# Patient Record
Sex: Female | Born: 1970
Health system: Southern US, Community
[De-identification: ages and names within clinical notes are randomized; demographics above are authoritative.]

## PROBLEM LIST (undated history)

## (undated) DIAGNOSIS — F909 Attention-deficit hyperactivity disorder, unspecified type: Secondary | ICD-10-CM

## (undated) DIAGNOSIS — R011 Cardiac murmur, unspecified: Secondary | ICD-10-CM

## (undated) DIAGNOSIS — F32A Depression, unspecified: Secondary | ICD-10-CM

## (undated) HISTORY — DX: Attention-deficit hyperactivity disorder, unspecified type: F90.9

## (undated) HISTORY — PX: LEEP: SHX91

## (undated) HISTORY — DX: Cardiac murmur, unspecified: R01.1

## (undated) HISTORY — DX: Depression, unspecified: F32.A

---

## 2015-05-10 DIAGNOSIS — F4320 Adjustment disorder, unspecified: Secondary | ICD-10-CM | POA: Diagnosis not present

## 2015-05-31 DIAGNOSIS — F4320 Adjustment disorder, unspecified: Secondary | ICD-10-CM | POA: Diagnosis not present

## 2015-06-23 DIAGNOSIS — F4320 Adjustment disorder, unspecified: Secondary | ICD-10-CM | POA: Diagnosis not present

## 2015-07-04 DIAGNOSIS — Z6824 Body mass index (BMI) 24.0-24.9, adult: Secondary | ICD-10-CM | POA: Diagnosis not present

## 2015-07-04 DIAGNOSIS — Z01419 Encounter for gynecological examination (general) (routine) without abnormal findings: Secondary | ICD-10-CM | POA: Diagnosis not present

## 2015-07-04 DIAGNOSIS — Z1231 Encounter for screening mammogram for malignant neoplasm of breast: Secondary | ICD-10-CM | POA: Diagnosis not present

## 2015-07-14 DIAGNOSIS — F4320 Adjustment disorder, unspecified: Secondary | ICD-10-CM | POA: Diagnosis not present

## 2015-08-11 DIAGNOSIS — F4320 Adjustment disorder, unspecified: Secondary | ICD-10-CM | POA: Diagnosis not present

## 2015-08-30 DIAGNOSIS — F4320 Adjustment disorder, unspecified: Secondary | ICD-10-CM | POA: Diagnosis not present

## 2015-09-15 DIAGNOSIS — F4320 Adjustment disorder, unspecified: Secondary | ICD-10-CM | POA: Diagnosis not present

## 2015-09-30 DIAGNOSIS — F4320 Adjustment disorder, unspecified: Secondary | ICD-10-CM | POA: Diagnosis not present

## 2015-10-12 DIAGNOSIS — F4320 Adjustment disorder, unspecified: Secondary | ICD-10-CM | POA: Diagnosis not present

## 2015-10-24 DIAGNOSIS — F4320 Adjustment disorder, unspecified: Secondary | ICD-10-CM | POA: Diagnosis not present

## 2015-11-08 DIAGNOSIS — F4320 Adjustment disorder, unspecified: Secondary | ICD-10-CM | POA: Diagnosis not present

## 2015-11-21 DIAGNOSIS — F4320 Adjustment disorder, unspecified: Secondary | ICD-10-CM | POA: Diagnosis not present

## 2015-11-25 DIAGNOSIS — Z23 Encounter for immunization: Secondary | ICD-10-CM | POA: Diagnosis not present

## 2016-01-03 DIAGNOSIS — F4320 Adjustment disorder, unspecified: Secondary | ICD-10-CM | POA: Diagnosis not present

## 2016-01-31 DIAGNOSIS — F4320 Adjustment disorder, unspecified: Secondary | ICD-10-CM | POA: Diagnosis not present

## 2016-02-20 DIAGNOSIS — F4320 Adjustment disorder, unspecified: Secondary | ICD-10-CM | POA: Diagnosis not present

## 2016-02-24 DIAGNOSIS — F4323 Adjustment disorder with mixed anxiety and depressed mood: Secondary | ICD-10-CM | POA: Diagnosis not present

## 2016-03-08 DIAGNOSIS — F4323 Adjustment disorder with mixed anxiety and depressed mood: Secondary | ICD-10-CM | POA: Diagnosis not present

## 2016-03-13 DIAGNOSIS — F4320 Adjustment disorder, unspecified: Secondary | ICD-10-CM | POA: Diagnosis not present

## 2016-03-27 DIAGNOSIS — F4320 Adjustment disorder, unspecified: Secondary | ICD-10-CM | POA: Diagnosis not present

## 2016-04-18 DIAGNOSIS — F4320 Adjustment disorder, unspecified: Secondary | ICD-10-CM | POA: Diagnosis not present

## 2016-04-19 DIAGNOSIS — L814 Other melanin hyperpigmentation: Secondary | ICD-10-CM | POA: Diagnosis not present

## 2016-04-19 DIAGNOSIS — L821 Other seborrheic keratosis: Secondary | ICD-10-CM | POA: Diagnosis not present

## 2016-04-19 DIAGNOSIS — D225 Melanocytic nevi of trunk: Secondary | ICD-10-CM | POA: Diagnosis not present

## 2016-04-19 DIAGNOSIS — D1800 Hemangioma unspecified site: Secondary | ICD-10-CM | POA: Diagnosis not present

## 2016-04-24 DIAGNOSIS — F4323 Adjustment disorder with mixed anxiety and depressed mood: Secondary | ICD-10-CM | POA: Diagnosis not present

## 2016-05-07 DIAGNOSIS — M25561 Pain in right knee: Secondary | ICD-10-CM | POA: Diagnosis not present

## 2016-05-07 DIAGNOSIS — T148XXA Other injury of unspecified body region, initial encounter: Secondary | ICD-10-CM | POA: Diagnosis not present

## 2016-05-16 DIAGNOSIS — F4320 Adjustment disorder, unspecified: Secondary | ICD-10-CM | POA: Diagnosis not present

## 2016-05-21 DIAGNOSIS — R232 Flushing: Secondary | ICD-10-CM | POA: Diagnosis not present

## 2016-05-21 DIAGNOSIS — N926 Irregular menstruation, unspecified: Secondary | ICD-10-CM | POA: Diagnosis not present

## 2016-05-22 DIAGNOSIS — F4323 Adjustment disorder with mixed anxiety and depressed mood: Secondary | ICD-10-CM | POA: Diagnosis not present

## 2016-05-28 DIAGNOSIS — F4320 Adjustment disorder, unspecified: Secondary | ICD-10-CM | POA: Diagnosis not present

## 2016-06-06 DIAGNOSIS — F4323 Adjustment disorder with mixed anxiety and depressed mood: Secondary | ICD-10-CM | POA: Diagnosis not present

## 2016-06-19 DIAGNOSIS — F4320 Adjustment disorder, unspecified: Secondary | ICD-10-CM | POA: Diagnosis not present

## 2016-06-21 DIAGNOSIS — F4323 Adjustment disorder with mixed anxiety and depressed mood: Secondary | ICD-10-CM | POA: Diagnosis not present

## 2016-07-03 DIAGNOSIS — F4323 Adjustment disorder with mixed anxiety and depressed mood: Secondary | ICD-10-CM | POA: Diagnosis not present

## 2016-07-09 DIAGNOSIS — F4320 Adjustment disorder, unspecified: Secondary | ICD-10-CM | POA: Diagnosis not present

## 2016-07-10 DIAGNOSIS — Z6825 Body mass index (BMI) 25.0-25.9, adult: Secondary | ICD-10-CM | POA: Diagnosis not present

## 2016-07-10 DIAGNOSIS — Z1231 Encounter for screening mammogram for malignant neoplasm of breast: Secondary | ICD-10-CM | POA: Diagnosis not present

## 2016-07-10 DIAGNOSIS — Z01419 Encounter for gynecological examination (general) (routine) without abnormal findings: Secondary | ICD-10-CM | POA: Diagnosis not present

## 2016-07-11 DIAGNOSIS — F4323 Adjustment disorder with mixed anxiety and depressed mood: Secondary | ICD-10-CM | POA: Diagnosis not present

## 2016-07-27 DIAGNOSIS — F4323 Adjustment disorder with mixed anxiety and depressed mood: Secondary | ICD-10-CM | POA: Diagnosis not present

## 2016-08-02 DIAGNOSIS — F4320 Adjustment disorder, unspecified: Secondary | ICD-10-CM | POA: Diagnosis not present

## 2016-08-07 DIAGNOSIS — F4323 Adjustment disorder with mixed anxiety and depressed mood: Secondary | ICD-10-CM | POA: Diagnosis not present

## 2016-08-10 DIAGNOSIS — F4323 Adjustment disorder with mixed anxiety and depressed mood: Secondary | ICD-10-CM | POA: Diagnosis not present

## 2016-08-14 DIAGNOSIS — F4320 Adjustment disorder, unspecified: Secondary | ICD-10-CM | POA: Diagnosis not present

## 2016-08-31 DIAGNOSIS — F4323 Adjustment disorder with mixed anxiety and depressed mood: Secondary | ICD-10-CM | POA: Diagnosis not present

## 2016-09-04 DIAGNOSIS — F4320 Adjustment disorder, unspecified: Secondary | ICD-10-CM | POA: Diagnosis not present

## 2016-09-07 DIAGNOSIS — F4323 Adjustment disorder with mixed anxiety and depressed mood: Secondary | ICD-10-CM | POA: Diagnosis not present

## 2016-09-19 DIAGNOSIS — F4320 Adjustment disorder, unspecified: Secondary | ICD-10-CM | POA: Diagnosis not present

## 2016-09-20 DIAGNOSIS — F4323 Adjustment disorder with mixed anxiety and depressed mood: Secondary | ICD-10-CM | POA: Diagnosis not present

## 2016-10-04 DIAGNOSIS — F4320 Adjustment disorder, unspecified: Secondary | ICD-10-CM | POA: Diagnosis not present

## 2016-10-16 DIAGNOSIS — F4320 Adjustment disorder, unspecified: Secondary | ICD-10-CM | POA: Diagnosis not present

## 2016-10-19 DIAGNOSIS — F4323 Adjustment disorder with mixed anxiety and depressed mood: Secondary | ICD-10-CM | POA: Diagnosis not present

## 2016-10-30 DIAGNOSIS — F4323 Adjustment disorder with mixed anxiety and depressed mood: Secondary | ICD-10-CM | POA: Diagnosis not present

## 2016-11-13 DIAGNOSIS — F4320 Adjustment disorder, unspecified: Secondary | ICD-10-CM | POA: Diagnosis not present

## 2016-11-14 DIAGNOSIS — F4323 Adjustment disorder with mixed anxiety and depressed mood: Secondary | ICD-10-CM | POA: Diagnosis not present

## 2016-11-20 DIAGNOSIS — Z23 Encounter for immunization: Secondary | ICD-10-CM | POA: Diagnosis not present

## 2016-11-27 DIAGNOSIS — F4323 Adjustment disorder with mixed anxiety and depressed mood: Secondary | ICD-10-CM | POA: Diagnosis not present

## 2016-12-04 DIAGNOSIS — F4320 Adjustment disorder, unspecified: Secondary | ICD-10-CM | POA: Diagnosis not present

## 2016-12-13 DIAGNOSIS — F4323 Adjustment disorder with mixed anxiety and depressed mood: Secondary | ICD-10-CM | POA: Diagnosis not present

## 2016-12-17 DIAGNOSIS — F4323 Adjustment disorder with mixed anxiety and depressed mood: Secondary | ICD-10-CM | POA: Diagnosis not present

## 2016-12-18 DIAGNOSIS — N390 Urinary tract infection, site not specified: Secondary | ICD-10-CM | POA: Diagnosis not present

## 2016-12-28 DIAGNOSIS — R102 Pelvic and perineal pain: Secondary | ICD-10-CM | POA: Diagnosis not present

## 2016-12-28 DIAGNOSIS — R300 Dysuria: Secondary | ICD-10-CM | POA: Diagnosis not present

## 2017-01-03 DIAGNOSIS — F4320 Adjustment disorder, unspecified: Secondary | ICD-10-CM | POA: Diagnosis not present

## 2017-01-10 DIAGNOSIS — F4323 Adjustment disorder with mixed anxiety and depressed mood: Secondary | ICD-10-CM | POA: Diagnosis not present

## 2017-01-16 DIAGNOSIS — F4323 Adjustment disorder with mixed anxiety and depressed mood: Secondary | ICD-10-CM | POA: Diagnosis not present

## 2017-01-30 DIAGNOSIS — R300 Dysuria: Secondary | ICD-10-CM | POA: Diagnosis not present

## 2017-01-31 DIAGNOSIS — F4323 Adjustment disorder with mixed anxiety and depressed mood: Secondary | ICD-10-CM | POA: Diagnosis not present

## 2017-02-04 DIAGNOSIS — R300 Dysuria: Secondary | ICD-10-CM | POA: Diagnosis not present

## 2017-02-04 DIAGNOSIS — Z118 Encounter for screening for other infectious and parasitic diseases: Secondary | ICD-10-CM | POA: Diagnosis not present

## 2017-02-04 DIAGNOSIS — N760 Acute vaginitis: Secondary | ICD-10-CM | POA: Diagnosis not present

## 2017-02-13 DIAGNOSIS — F4323 Adjustment disorder with mixed anxiety and depressed mood: Secondary | ICD-10-CM | POA: Diagnosis not present

## 2017-02-14 DIAGNOSIS — F4320 Adjustment disorder, unspecified: Secondary | ICD-10-CM | POA: Diagnosis not present

## 2017-02-25 DIAGNOSIS — B373 Candidiasis of vulva and vagina: Secondary | ICD-10-CM | POA: Diagnosis not present

## 2017-02-25 DIAGNOSIS — R102 Pelvic and perineal pain: Secondary | ICD-10-CM | POA: Diagnosis not present

## 2017-02-28 DIAGNOSIS — F4320 Adjustment disorder, unspecified: Secondary | ICD-10-CM | POA: Diagnosis not present

## 2017-03-07 DIAGNOSIS — F4323 Adjustment disorder with mixed anxiety and depressed mood: Secondary | ICD-10-CM | POA: Diagnosis not present

## 2017-03-14 DIAGNOSIS — F4320 Adjustment disorder, unspecified: Secondary | ICD-10-CM | POA: Diagnosis not present

## 2017-03-21 DIAGNOSIS — N898 Other specified noninflammatory disorders of vagina: Secondary | ICD-10-CM | POA: Diagnosis not present

## 2017-03-21 DIAGNOSIS — F4323 Adjustment disorder with mixed anxiety and depressed mood: Secondary | ICD-10-CM | POA: Diagnosis not present

## 2017-03-21 DIAGNOSIS — R350 Frequency of micturition: Secondary | ICD-10-CM | POA: Diagnosis not present

## 2017-04-03 DIAGNOSIS — F4320 Adjustment disorder, unspecified: Secondary | ICD-10-CM | POA: Diagnosis not present

## 2017-04-09 DIAGNOSIS — F4323 Adjustment disorder with mixed anxiety and depressed mood: Secondary | ICD-10-CM | POA: Diagnosis not present

## 2017-04-25 DIAGNOSIS — F4320 Adjustment disorder, unspecified: Secondary | ICD-10-CM | POA: Diagnosis not present

## 2017-04-29 DIAGNOSIS — F4323 Adjustment disorder with mixed anxiety and depressed mood: Secondary | ICD-10-CM | POA: Diagnosis not present

## 2017-05-29 DIAGNOSIS — F4323 Adjustment disorder with mixed anxiety and depressed mood: Secondary | ICD-10-CM | POA: Diagnosis not present

## 2017-05-30 DIAGNOSIS — F4320 Adjustment disorder, unspecified: Secondary | ICD-10-CM | POA: Diagnosis not present

## 2017-06-12 DIAGNOSIS — F4323 Adjustment disorder with mixed anxiety and depressed mood: Secondary | ICD-10-CM | POA: Diagnosis not present

## 2017-06-18 DIAGNOSIS — H906 Mixed conductive and sensorineural hearing loss, bilateral: Secondary | ICD-10-CM | POA: Diagnosis not present

## 2017-06-18 DIAGNOSIS — H903 Sensorineural hearing loss, bilateral: Secondary | ICD-10-CM | POA: Diagnosis not present

## 2017-06-19 DIAGNOSIS — F4320 Adjustment disorder, unspecified: Secondary | ICD-10-CM | POA: Diagnosis not present

## 2017-06-21 DIAGNOSIS — F4323 Adjustment disorder with mixed anxiety and depressed mood: Secondary | ICD-10-CM | POA: Diagnosis not present

## 2017-06-28 DIAGNOSIS — F4323 Adjustment disorder with mixed anxiety and depressed mood: Secondary | ICD-10-CM | POA: Diagnosis not present

## 2017-07-15 DIAGNOSIS — F4323 Adjustment disorder with mixed anxiety and depressed mood: Secondary | ICD-10-CM | POA: Diagnosis not present

## 2017-07-17 DIAGNOSIS — Z1151 Encounter for screening for human papillomavirus (HPV): Secondary | ICD-10-CM | POA: Diagnosis not present

## 2017-07-17 DIAGNOSIS — Z1231 Encounter for screening mammogram for malignant neoplasm of breast: Secondary | ICD-10-CM | POA: Diagnosis not present

## 2017-07-17 DIAGNOSIS — Z01419 Encounter for gynecological examination (general) (routine) without abnormal findings: Secondary | ICD-10-CM | POA: Diagnosis not present

## 2017-07-17 DIAGNOSIS — Z6822 Body mass index (BMI) 22.0-22.9, adult: Secondary | ICD-10-CM | POA: Diagnosis not present

## 2017-07-18 DIAGNOSIS — F4320 Adjustment disorder, unspecified: Secondary | ICD-10-CM | POA: Diagnosis not present

## 2017-07-22 DIAGNOSIS — F4323 Adjustment disorder with mixed anxiety and depressed mood: Secondary | ICD-10-CM | POA: Diagnosis not present

## 2017-07-29 DIAGNOSIS — F4323 Adjustment disorder with mixed anxiety and depressed mood: Secondary | ICD-10-CM | POA: Diagnosis not present

## 2017-08-19 DIAGNOSIS — F4320 Adjustment disorder, unspecified: Secondary | ICD-10-CM | POA: Diagnosis not present

## 2017-09-16 DIAGNOSIS — F4320 Adjustment disorder, unspecified: Secondary | ICD-10-CM | POA: Diagnosis not present

## 2017-10-14 DIAGNOSIS — F4320 Adjustment disorder, unspecified: Secondary | ICD-10-CM | POA: Diagnosis not present

## 2017-11-14 DIAGNOSIS — F4320 Adjustment disorder, unspecified: Secondary | ICD-10-CM | POA: Diagnosis not present

## 2017-12-19 DIAGNOSIS — F4320 Adjustment disorder, unspecified: Secondary | ICD-10-CM | POA: Diagnosis not present

## 2018-01-16 DIAGNOSIS — F4320 Adjustment disorder, unspecified: Secondary | ICD-10-CM | POA: Diagnosis not present

## 2018-02-05 DIAGNOSIS — F4320 Adjustment disorder, unspecified: Secondary | ICD-10-CM | POA: Diagnosis not present

## 2018-03-05 DIAGNOSIS — F4320 Adjustment disorder, unspecified: Secondary | ICD-10-CM | POA: Diagnosis not present

## 2018-03-26 DIAGNOSIS — F4320 Adjustment disorder, unspecified: Secondary | ICD-10-CM | POA: Diagnosis not present

## 2018-04-11 DIAGNOSIS — F4320 Adjustment disorder, unspecified: Secondary | ICD-10-CM | POA: Diagnosis not present

## 2018-05-06 DIAGNOSIS — F4320 Adjustment disorder, unspecified: Secondary | ICD-10-CM | POA: Diagnosis not present

## 2018-05-26 DIAGNOSIS — F4320 Adjustment disorder, unspecified: Secondary | ICD-10-CM | POA: Diagnosis not present

## 2018-05-30 DIAGNOSIS — N950 Postmenopausal bleeding: Secondary | ICD-10-CM | POA: Diagnosis not present

## 2018-06-09 DIAGNOSIS — N950 Postmenopausal bleeding: Secondary | ICD-10-CM | POA: Diagnosis not present

## 2018-06-30 DIAGNOSIS — F4320 Adjustment disorder, unspecified: Secondary | ICD-10-CM | POA: Diagnosis not present

## 2018-07-17 ENCOUNTER — Ambulatory Visit (HOSPITAL_COMMUNITY)
Admission: EM | Admit: 2018-07-17 | Discharge: 2018-07-17 | Disposition: A | Discharge status: Home / Self Care | Payer: BC Managed Care – PPO | Attending: Family Medicine | Admitting: Family Medicine

## 2018-07-17 ENCOUNTER — Encounter (HOSPITAL_COMMUNITY): Payer: Self-pay | Admitting: Emergency Medicine

## 2018-07-17 DIAGNOSIS — A6920 Lyme disease, unspecified: Secondary | ICD-10-CM

## 2018-07-17 DIAGNOSIS — W57XXXA Bitten or stung by nonvenomous insect and other nonvenomous arthropods, initial encounter: Secondary | ICD-10-CM

## 2018-07-17 DIAGNOSIS — S70369A Insect bite (nonvenomous), unspecified thigh, initial encounter: Secondary | ICD-10-CM

## 2018-07-17 MED ORDER — DOXYCYCLINE HYCLATE 100 MG PO CAPS: ORAL_CAPSULE | ORAL | 0 refills | Status: DC

## 2018-07-17 MED ORDER — DOXYCYCLINE HYCLATE 100 MG PO CAPS: 100.0000 mg | ORAL_CAPSULE | Freq: Two times a day (BID) | ORAL | 0 refills | Status: AC

## 2018-07-17 NOTE — Discharge Instructions (Signed)
Take antibiotic 2 times a day for 2 weeks Take with food to prevent stomach upset I recommend a probiotic to prevent GI distress Call for problems

## 2018-07-17 NOTE — ED Triage Notes (Signed)
Pt states shes been bitten by several ticks, states she noticed a spot on her leg with a rash, doesn't remember if she was bitten there or not. Rash x3 days.

## 2018-07-17 NOTE — ED Provider Notes (Signed)
[facility]    CSN: [id number] Arrival date & time: 07/17/18  [id number]     History   Chief Complaint Chief Complaint  Patient presents with  . Appointment    950  . Rash    HPI Michele Sanchez is a 48 y.o. female.   HPI   Healthy on no meds Has had several tick bites this spring Has removed 3 in last 2-3 weeks Had one on her thigh 10 d ago, removed.  Now for the last 3 d has gradually expanding rash around area No fever or chills No myalgia or arthralgia  History reviewed. No pertinent past medical history.  There are no active problems to display for this patient.   Past Surgical History:  Procedure Laterality Date  . CESAREAN SECTION      OB History   No obstetric history on file.      Home Medications    Prior to Admission medications   Medication Sig Start Date End Date Taking? Authorizing Provider  doxycycline (VIBRAMYCIN) 100 MG capsule Take 1 capsule (100 mg total) by mouth 2 (two) times daily for 14 days. 07/17/18 07/31/18  Eustace MooreNelson, Kassim Guertin Sue, MD    Family History Family History  Problem Relation Age of Onset  . Heart failure Mother   . Diabetes Sister     Social History Social History   Tobacco Use  . Smoking status: Never Smoker  Substance Use Topics  . Alcohol use: Yes  . Drug use: Never     Allergies   Cashew nut oil   Review of Systems Review of Systems  Constitutional: Negative for chills and fever.  HENT: Negative for ear pain and sore throat.   Eyes: Negative for pain and visual disturbance.  Respiratory: Negative for cough and shortness of breath.   Cardiovascular: Negative for chest pain and palpitations.  Gastrointestinal: Negative for abdominal pain and vomiting.  Genitourinary: Negative for dysuria and hematuria.  Musculoskeletal: Negative for arthralgias and back pain.  Skin: Positive for wound. Negative for color change and rash.  Neurological: Negative for seizures and syncope.  All other systems  reviewed and are negative.    Physical Exam Triage Vital Signs ED Triage Vitals  Enc Vitals Group     BP 07/17/18 0950 (!) 116/49     Pulse Rate 07/17/18 0950 83     Resp 07/17/18 0950 16     Temp 07/17/18 0950 98.5 F (36.9 C)     Temp src --      SpO2 07/17/18 0950 99 %     Weight --      Height --      Head Circumference --      Peak Flow --      Pain Score 07/17/18 0953 1     Pain Loc --      Pain Edu? --      Excl. in GC? --    No data found.  Updated Vital Signs BP (!) 116/49   Pulse 83   Temp 98.5 F (36.9 C)   Resp 16   SpO2 99%      Physical Exam Constitutional:      General: She is not in acute distress.    Appearance: She is well-developed.  HENT:     Head: Normocephalic and atraumatic.  Eyes:     Conjunctiva/sclera: Conjunctivae normal.     Pupils: Pupils are equal, round, and reactive to light.  Neck:     Musculoskeletal:  Normal range of motion.  Cardiovascular:     Rate and Rhythm: Normal rate.  Pulmonary:     Effort: Pulmonary effort is normal. No respiratory distress.  Abdominal:     General: There is no distension.     Palpations: Abdomen is soft.  Musculoskeletal: Normal range of motion.  Skin:    General: Skin is warm and dry.     Comments: See Photo.  The photo does not capture the clearing center and advancing edge well, although it is faintly present.  Neurological:     Mental Status: She is alert.        UC Treatments / Results  Labs (all labs ordered are listed, but only abnormal results are displayed) Labs Reviewed - No data to display  EKG None  Radiology No results found.  Procedures Procedures (including critical care time)  Medications Ordered in UC Medications - No data to display  Initial Impression / Assessment and Plan / UC Course  I have reviewed the triage vital signs and the nursing notes.  Pertinent labs & imaging results that were available during my care of the patient were reviewed by me and  considered in my medical decision making (see chart for details).     Discussed Lyme disease.  Given written information Final Clinical Impressions(s) / UC Diagnoses   Final diagnoses:  Tick bite, initial encounter  Erythema migrans (Lyme disease)     Discharge Instructions     Take antibiotic 2 times a day for 2 weeks Take with food to prevent stomach upset I recommend a probiotic to prevent GI distress Call for problems    ED Prescriptions    Medication Sig Dispense Auth. Provider   doxycycline (VIBRAMYCIN) 100 MG capsule Take 1 capsule (100 mg total) by mouth 2 (two) times daily for 14 days. 28 capsule Raylene Everts, MD     Controlled Substance Prescriptions [place] Controlled Substance Registry consulted? Not Applicable   Raylene Everts, MD 07/17/18 1054

## 2018-07-21 DIAGNOSIS — F4320 Adjustment disorder, unspecified: Secondary | ICD-10-CM | POA: Diagnosis not present

## 2018-07-22 DIAGNOSIS — Z6824 Body mass index (BMI) 24.0-24.9, adult: Secondary | ICD-10-CM | POA: Diagnosis not present

## 2018-07-22 DIAGNOSIS — Z01419 Encounter for gynecological examination (general) (routine) without abnormal findings: Secondary | ICD-10-CM | POA: Diagnosis not present

## 2018-07-22 DIAGNOSIS — Z124 Encounter for screening for malignant neoplasm of cervix: Secondary | ICD-10-CM | POA: Diagnosis not present

## 2018-07-22 DIAGNOSIS — Z1231 Encounter for screening mammogram for malignant neoplasm of breast: Secondary | ICD-10-CM | POA: Diagnosis not present

## 2018-07-22 DIAGNOSIS — Z1151 Encounter for screening for human papillomavirus (HPV): Secondary | ICD-10-CM | POA: Diagnosis not present

## 2018-08-04 DIAGNOSIS — Z13220 Encounter for screening for lipoid disorders: Secondary | ICD-10-CM | POA: Diagnosis not present

## 2018-08-04 DIAGNOSIS — Z0000 Encounter for general adult medical examination without abnormal findings: Secondary | ICD-10-CM | POA: Diagnosis not present

## 2018-08-04 DIAGNOSIS — Z1329 Encounter for screening for other suspected endocrine disorder: Secondary | ICD-10-CM | POA: Diagnosis not present

## 2018-08-04 DIAGNOSIS — R7301 Impaired fasting glucose: Secondary | ICD-10-CM | POA: Diagnosis not present

## 2018-08-14 DIAGNOSIS — F4320 Adjustment disorder, unspecified: Secondary | ICD-10-CM | POA: Diagnosis not present

## 2018-09-16 DIAGNOSIS — F4320 Adjustment disorder, unspecified: Secondary | ICD-10-CM | POA: Diagnosis not present

## 2018-09-18 DIAGNOSIS — B373 Candidiasis of vulva and vagina: Secondary | ICD-10-CM | POA: Diagnosis not present

## 2018-09-18 DIAGNOSIS — R300 Dysuria: Secondary | ICD-10-CM | POA: Diagnosis not present

## 2018-09-25 DIAGNOSIS — R350 Frequency of micturition: Secondary | ICD-10-CM | POA: Diagnosis not present

## 2018-10-03 DIAGNOSIS — R109 Unspecified abdominal pain: Secondary | ICD-10-CM | POA: Diagnosis not present

## 2018-10-13 DIAGNOSIS — R350 Frequency of micturition: Secondary | ICD-10-CM | POA: Diagnosis not present

## 2018-10-20 DIAGNOSIS — F4320 Adjustment disorder, unspecified: Secondary | ICD-10-CM | POA: Diagnosis not present

## 2018-10-24 ENCOUNTER — Ambulatory Visit (HOSPITAL_COMMUNITY)
Admission: EM | Admit: 2018-10-24 | Discharge: 2018-10-24 | Disposition: A | Discharge status: Home / Self Care | Payer: BC Managed Care – PPO | Attending: Emergency Medicine | Admitting: Emergency Medicine

## 2018-10-24 ENCOUNTER — Other Ambulatory Visit: Payer: Self-pay

## 2018-10-24 ENCOUNTER — Encounter (HOSPITAL_COMMUNITY): Payer: Self-pay

## 2018-10-24 DIAGNOSIS — M5432 Sciatica, left side: Secondary | ICD-10-CM

## 2018-10-24 DIAGNOSIS — D2371 Other benign neoplasm of skin of right lower limb, including hip: Secondary | ICD-10-CM | POA: Diagnosis not present

## 2018-10-24 DIAGNOSIS — D225 Melanocytic nevi of trunk: Secondary | ICD-10-CM | POA: Diagnosis not present

## 2018-10-24 MED ORDER — IBUPROFEN 600 MG PO TABS: 600.0000 mg | ORAL_TABLET | Freq: Four times a day (QID) | ORAL | 0 refills | Status: DC | PRN

## 2018-10-24 MED ORDER — METHYLPREDNISOLONE 4 MG PO TBPK: ORAL_TABLET | Freq: Every day | ORAL | 0 refills | Status: DC

## 2018-10-24 MED ORDER — TIZANIDINE HCL 4 MG PO TABS: 4.0000 mg | ORAL_TABLET | Freq: Three times a day (TID) | ORAL | 0 refills | Status: DC | PRN

## 2018-10-24 NOTE — Discharge Instructions (Addendum)
Take 600 mg of ibuprofen combined with 1 g of Tylenol 3-4 times a day as needed for pain.  Continue pressure points, stretching, massage.  Zanaflex will help with any spasms and the steroids will help with the inflammation.

## 2018-10-24 NOTE — ED Provider Notes (Signed)
HPI  SUBJECTIVE:  Michele Sanchez is a 48 y.o. female who presents with 1 month of daily, constant low back pain that goes down her buttock into the posterior leg.  She describes it as like spasms.  She states that she was doing heavy lifting prior to the symptoms starting.  No trauma, change in her physical activity.  No fevers, nausea, vomiting, urinary complaints, saddle anesthesia.  No bilateral radicular pain, leg weakness, distal numbness or tingling, urinary or fecal incontinence, urinary retention.  No syncope.  She has tried Tylenol, massage, pressure-point release, change in her diet, stretching.  She takes Advil 200 to 400 mg once or twice a day from time to time.  Last dose was within 4-6 hours of evaluation.  She has also tried Aleve.  Symptoms are better with pressure points, Advil, standing.  Symptoms worse with sitting, bending forward.  She has a past medical history of a "pinched nerve" in her neck, UTI.  No history of pyelonephritis, nephrolithiasis, back injury, HIV, cancer, multiple myeloma, diabetes, hypertension, IV drug use.  LMP: She is perimenopausal.  April 2020.  Denies the possibility of being pregnant.  PMD: Her OB/GYN.   History reviewed. No pertinent past medical history.  Past Surgical History:  Procedure Laterality Date  . CESAREAN SECTION      Family History  Problem Relation Age of Onset  . Heart failure Mother   . Diabetes Sister     Social History   Tobacco Use  . Smoking status: Never Smoker  Substance Use Topics  . Alcohol use: Yes  . Drug use: Never    No current facility-administered medications for this encounter.   Current Outpatient Medications:  .  Estradiol 10 MCG TABS vaginal tablet, I 1 T INTRAVAGINALLY 2 TIMES A WK, Disp: , Rfl:  .  ibuprofen (ADVIL) 600 MG tablet, Take 1 tablet (600 mg total) by mouth every 6 (six) hours as needed., Disp: 30 tablet, Rfl: 0 .  methylPREDNISolone (MEDROL DOSEPAK) 4 MG TBPK tablet, Take by mouth  daily. Follow package instructions, Disp: 21 tablet, Rfl: 0 .  tiZANidine (ZANAFLEX) 4 MG tablet, Take 1 tablet (4 mg total) by mouth every 8 (eight) hours as needed for muscle spasms., Disp: 30 tablet, Rfl: 0  Allergies  Allergen Reactions  . Cashew Nut Oil      ROS  As noted in HPI.   Physical Exam  BP 122/81 (BP Location: Right Arm)   Pulse 76   Temp 98 F (36.7 C) (Oral)   Resp 14   SpO2 99%   Constitutional: Well developed, well nourished, no acute distress Eyes:  EOMI, conjunctiva normal bilaterally HENT: Normocephalic, atraumatic,mucus membranes moist Respiratory: Normal inspiratory effort Cardiovascular: Normal rate GI: nondistended. No suprapubic tenderness skin: No rash, skin intact Musculoskeletal: no CVAT.  - paralumbar tenderness, - muscle spasm.  No L-spine tenderness.  Positive tenderness over the left lower buttock, at sciatic notch, down the back of the posterior upper leg.  Bilateral lower extremities nontender, baseline ROM with intact DP pulses. No pain with int/ext rotation flex hips bilaterally.  Pain aggravated with left hip extension against resistance.  SLR neg bilaterally. Sensation baseline light touch bilaterally for Pt, DTR's symmetric and intact bilaterally KJ, Motor symmetric bilateral 5/5 hip flexion, quadriceps, hamstrings, EHL, foot dorsiflexion, foot plantarflexion, gait normal. Neurologic: Alert & oriented x 3, no focal neuro deficits Psychiatric: Speech and behavior appropriate   ED Course   Medications - No data to display  No orders of the defined types were placed in this encounter.   No results found for this or any previous visit (from the past 24 hour(s)). No results found.  ED Clinical Impression  1. Sciatica of left side     ED Assessment/Plan  Patient with left-sided sciatica.  No evidence of spinal cord involvement based on H&P.  Pain has been < 6 week duration. Offered to do L-spine films today, but patient declined.   Advised her that she will need to have imaging of some sort if this does not resolve in 2 weeks. No historical red flags as noted in HPI. No physical red flags such as fever, bony tenderness, lower extremity weakness, saddle anesthesia.  Patient took Advil 400 mg about 2 hours prior to evaluation. cannt give toradol.  Pt ambulatory in the UC. Home with 600 mg ibuprofen combined with 1 g of Tylenol 3-4 x/day, Medrol Dosepak, Zanaflex.  We will have her follow-up with orthopedics, Dr. Doreatha Martin on call, or with sports medicine Drs. Murriel Hopper or Raeford Razor for further management.  Discussed  medical decision-making, and plan for follow-up with the patient.  Discussed signs and symptoms that should prompt return to the emergency department.  Patient agrees with plan.   Meds ordered this encounter  Medications  . ibuprofen (ADVIL) 600 MG tablet    Sig: Take 1 tablet (600 mg total) by mouth every 6 (six) hours as needed.    Dispense:  30 tablet    Refill:  0  . tiZANidine (ZANAFLEX) 4 MG tablet    Sig: Take 1 tablet (4 mg total) by mouth every 8 (eight) hours as needed for muscle spasms.    Dispense:  30 tablet    Refill:  0  . methylPREDNISolone (MEDROL DOSEPAK) 4 MG TBPK tablet    Sig: Take by mouth daily. Follow package instructions    Dispense:  21 tablet    Refill:  0    *This clinic note was created using Dragon dictation software. Therefore, there may be occasional mistakes despite careful proofreading.  ?     Melynda Ripple, MD 10/25/18 [phone]

## 2018-10-24 NOTE — ED Triage Notes (Signed)
Patient report having lower back pain and left buttock pain x 4 weeks,  is worse when she is sitting down. She is taking Tylenol .

## 2018-11-13 DIAGNOSIS — F4320 Adjustment disorder, unspecified: Secondary | ICD-10-CM | POA: Diagnosis not present

## 2018-11-16 NOTE — Progress Notes (Signed)
[facility] [place], [place] [address] Phone: [phone] Subjective:   Michele Sanchez, am serving as a scribe for Dr. Hulan Saas.  I'm seeing this patient by the request  of:    CC: Left-sided back pain  [id number]  Michele Sanchez is a 48 y.o. female coming in with complaint of lower back pain that started at the end of August. Pain radiates down to the left glute into her calf. Is constant. Last night had a hard time sleeping but has not been having issue sleeping until then. Has been using massage therapy and stretching. Patient has been active despite pain. Pain is less but radiating symptom are worse. Only used mm relaxer 2x as it did not help.      Sanchez past medical history on file. Past Surgical History:  Procedure Laterality Date  . CESAREAN SECTION     Social History   Socioeconomic History  . Marital status: Divorced    Spouse name: Not on file  . Number of children: Not on file  . Years of education: Not on file  . Highest education level: Not on file  Occupational History  . Not on file  Social Needs  . Financial resource strain: Not on file  . Food insecurity    Worry: Not on file    Inability: Not on file  . Transportation needs    Medical: Not on file    Non-medical: Not on file  Tobacco Use  . Smoking status: Never Smoker  Substance and Sexual Activity  . Alcohol use: Yes  . Drug use: Never  . Sexual activity: Not on file  Lifestyle  . Physical activity    Days per week: Not on file    Minutes per session: Not on file  . Stress: Not on file  Relationships  . Social Herbalist on phone: Not on file    Gets together: Not on file    Attends religious service: Not on file    Active member of club or organization: Not on file    Attends meetings of clubs or organizations: Not on file    Relationship status: Not on file  Other Topics Concern  . Not on file  Social History Narrative  .  Not on file   Allergies  Allergen Reactions  . Cashew Nut Oil    Family History  Problem Relation Age of Onset  . Heart failure Mother   . Diabetes Sister     Current Outpatient Medications (Endocrine & Metabolic):  .  methylPREDNISolone (MEDROL DOSEPAK) 4 MG TBPK tablet, Take by mouth daily. Follow package instructions    Current Outpatient Medications (Analgesics):  .  ibuprofen (ADVIL) 600 MG tablet, Take 1 tablet (600 mg total) by mouth every 6 (six) hours as needed. .  meloxicam (MOBIC) 15 MG tablet, Take 1 tablet (15 mg total) by mouth daily.   Current Outpatient Medications (Other):  .  Estradiol 10 MCG TABS vaginal tablet, I 1 T INTRAVAGINALLY 2 TIMES A WK .  tiZANidine (ZANAFLEX) 4 MG tablet, Take 1 tablet (4 mg total) by mouth every 8 (eight) hours as needed for muscle spasms. Marland Kitchen  gabapentin (NEURONTIN) 100 MG capsule, Take 2 capsules (200 mg total) by mouth at bedtime.    Past medical history, social, surgical and family history all reviewed in electronic medical record.  Sanchez pertanent information unless stated regarding to the chief complaint.   Review  of Systems:  Sanchez headache, visual changes, nausea, vomiting, diarrhea, constipation, dizziness, abdominal pain, skin rash, fevers, chills, night sweats, weight loss, swollen lymph nodes, body aches, joint swelling, muscle aches, chest pain, shortness of breath, mood changes.   Objective  Blood pressure 102/72, pulse 91, weight 143 lb (64.9 kg), SpO2 97 %.    General: Sanchez apparent distress alert and oriented x3 mood and affect normal, dressed appropriately.  HEENT: Pupils equal, extraocular movements intact  Respiratory: Patient's speak in full sentences and does not appear short of breath  Cardiovascular: Sanchez lower extremity edema, non tender, Sanchez erythema  Skin: Warm dry intact with Sanchez signs of infection or rash on extremities or on axial skeleton.  Abdomen: Soft nontender  Neuro: Cranial nerves II through XII are  intact, neurovascularly intact in all extremities with 2+ DTRs and 2+ pulses.  Lymph: Sanchez lymphadenopathy of posterior or anterior cervical chain or axillae bilaterally.  Gait normal with good balance and coordination.  MSK:  Non tender with full range of motion and good stability and symmetric strength and tone of shoulders, elbows, wrist, hip, knee and ankles bilaterally.  Back Exam:  Inspection: Mild scoliosis of the lumbar spine Motion: Flexion 45 deg, Extension 25 deg, Side Bending to 35 deg bilaterally,  Rotation to 45 deg bilaterally  SLR laying: Negative tightness left but Sanchez radicular symptoms XSLR laying: Negative  Palpable tenderness: Tender to palpation more in the paraspinal musculature in the lumbar spine L5-S1 but more over the piriformis on the left. FABER: Positive. Sensory change: Gross sensation intact to all lumbar and sacral dermatomes.  Reflexes: 2+ at both patellar tendons, 2+ at achilles tendons, Babinski's downgoing.  Strength at foot  Plantar-flexion: 5/5 Dorsi-flexion: 5/5 Eversion: 5/5 Inversion: 5/5  Leg strength  Quad: 5/5 Hamstring: 5/5 Hip flexor: 5/5 Hip abductors: 4/5 but symmetric   [id number]; 15 additional minutes spent for Therapeutic exercises as stated in above notes.  This included exercises focusing on stretching, strengthening, with significant focus on eccentric aspects.   Long term goals include an improvement in range of motion, strength, endurance as well as avoiding reinjury. Patient's frequency would include in 1-2 times a day, 3-5 times a week for a duration of 6-12 weeks.  Using Netter's Orthopaedic Anatomy, reviewed with the patient the structures involved and how they related to diagnosis. The patient indicated understanding.   The patient was given a handout about classic piriformis stretching including [employer], Modified [employer], my self-described "Sink Stretch," and other piriformis rehab.  We also reviewed hip flexor and abductor  strengthening, ham stretching  Rec deep massage, explained self-massage with ball Proper technique shown and discussed handout in great detail with ATC.  All questions were discussed and answered.     Impression and Recommendations:     This case required medical decision making of moderate complexity. The above documentation has been reviewed and is accurate and complete Judi Saa, DO       Note: This dictation was prepared with Dragon dictation along with smaller phrase technology. Any transcriptional errors that result from this process are unintentional.

## 2018-11-17 ENCOUNTER — Other Ambulatory Visit: Payer: Self-pay

## 2018-11-17 ENCOUNTER — Ambulatory Visit (INDEPENDENT_AMBULATORY_CARE_PROVIDER_SITE_OTHER): Payer: BC Managed Care – PPO | Admitting: Family Medicine

## 2018-11-17 ENCOUNTER — Ambulatory Visit (INDEPENDENT_AMBULATORY_CARE_PROVIDER_SITE_OTHER)
Admission: RE | Admit: 2018-11-17 | Discharge: 2018-11-17 | Disposition: A | Discharge status: Home / Self Care | Payer: BC Managed Care – PPO | Source: Ambulatory Visit | Attending: Family Medicine | Admitting: Family Medicine

## 2018-11-17 ENCOUNTER — Encounter: Payer: Self-pay | Admitting: Family Medicine

## 2018-11-17 VITALS — BP 102/72 | HR 91 | Wt 143.0 lb

## 2018-11-17 DIAGNOSIS — G5702 Lesion of sciatic nerve, left lower limb: Secondary | ICD-10-CM

## 2018-11-17 DIAGNOSIS — M545 Low back pain, unspecified: Secondary | ICD-10-CM

## 2018-11-17 DIAGNOSIS — M48061 Spinal stenosis, lumbar region without neurogenic claudication: Secondary | ICD-10-CM | POA: Diagnosis not present

## 2018-11-17 MED ORDER — MELOXICAM 15 MG PO TABS: 15.0000 mg | ORAL_TABLET | Freq: Every day | ORAL | 0 refills | Status: DC

## 2018-11-17 MED ORDER — GABAPENTIN 100 MG PO CAPS: 200.0000 mg | ORAL_CAPSULE | Freq: Every day | ORAL | 0 refills | Status: DC

## 2018-11-17 NOTE — Assessment & Plan Note (Signed)
Patient is more of a piriformis syndrome.  There is a possibility the differential includes a lumbar radiculopathy.  New x-rays pending.  Discussed low-dose gabapentin secondary to the nerve irritation that seems to be contributing.  Discussed icing regimen, home exercise, which activities to do which wants to avoid.  Patient is to increase activity slowly over the course of next several days.  Follow-up with me again in 4 to 8 weeks worsening symptoms will consider formal physical therapy or potential injections.  Could be a candidate for osteopathic manipulation

## 2018-11-17 NOTE — Patient Instructions (Signed)
Xray downstairs Meloxicam 10 days then as needed Turmeric 500mg  daily Vitamin D 2000IU daily Gabapentin 200mg  at night Tennis ball in back left pocket See me again in 6 weeks

## 2018-12-30 ENCOUNTER — Ambulatory Visit: Payer: BC Managed Care – PPO | Admitting: Family Medicine

## 2019-01-01 DIAGNOSIS — R1032 Left lower quadrant pain: Secondary | ICD-10-CM | POA: Diagnosis not present

## 2019-01-01 DIAGNOSIS — N950 Postmenopausal bleeding: Secondary | ICD-10-CM | POA: Diagnosis not present

## 2019-01-06 DIAGNOSIS — F4320 Adjustment disorder, unspecified: Secondary | ICD-10-CM | POA: Diagnosis not present

## 2019-01-07 DIAGNOSIS — R1032 Left lower quadrant pain: Secondary | ICD-10-CM | POA: Diagnosis not present

## 2019-01-07 DIAGNOSIS — N950 Postmenopausal bleeding: Secondary | ICD-10-CM | POA: Diagnosis not present

## 2019-02-18 DIAGNOSIS — F4320 Adjustment disorder, unspecified: Secondary | ICD-10-CM | POA: Diagnosis not present

## 2019-02-23 DIAGNOSIS — F4320 Adjustment disorder, unspecified: Secondary | ICD-10-CM | POA: Diagnosis not present

## 2019-03-17 DIAGNOSIS — F4320 Adjustment disorder, unspecified: Secondary | ICD-10-CM | POA: Diagnosis not present

## 2019-03-25 DIAGNOSIS — R300 Dysuria: Secondary | ICD-10-CM | POA: Diagnosis not present

## 2019-03-26 DIAGNOSIS — N952 Postmenopausal atrophic vaginitis: Secondary | ICD-10-CM | POA: Diagnosis not present

## 2019-03-26 DIAGNOSIS — N898 Other specified noninflammatory disorders of vagina: Secondary | ICD-10-CM | POA: Diagnosis not present

## 2019-03-26 DIAGNOSIS — N760 Acute vaginitis: Secondary | ICD-10-CM | POA: Diagnosis not present

## 2019-04-06 DIAGNOSIS — F4320 Adjustment disorder, unspecified: Secondary | ICD-10-CM | POA: Diagnosis not present

## 2019-04-10 DIAGNOSIS — N898 Other specified noninflammatory disorders of vagina: Secondary | ICD-10-CM | POA: Diagnosis not present

## 2019-04-10 DIAGNOSIS — N952 Postmenopausal atrophic vaginitis: Secondary | ICD-10-CM | POA: Diagnosis not present

## 2019-05-04 DIAGNOSIS — B373 Candidiasis of vulva and vagina: Secondary | ICD-10-CM | POA: Diagnosis not present

## 2019-05-04 DIAGNOSIS — Z01419 Encounter for gynecological examination (general) (routine) without abnormal findings: Secondary | ICD-10-CM | POA: Diagnosis not present

## 2019-05-04 DIAGNOSIS — R300 Dysuria: Secondary | ICD-10-CM | POA: Diagnosis not present

## 2019-05-07 DIAGNOSIS — F4320 Adjustment disorder, unspecified: Secondary | ICD-10-CM | POA: Diagnosis not present

## 2019-06-02 DIAGNOSIS — F4320 Adjustment disorder, unspecified: Secondary | ICD-10-CM | POA: Diagnosis not present

## 2019-06-23 DIAGNOSIS — F4320 Adjustment disorder, unspecified: Secondary | ICD-10-CM | POA: Diagnosis not present

## 2019-06-26 DIAGNOSIS — N898 Other specified noninflammatory disorders of vagina: Secondary | ICD-10-CM | POA: Diagnosis not present

## 2019-06-26 DIAGNOSIS — R350 Frequency of micturition: Secondary | ICD-10-CM | POA: Diagnosis not present

## 2019-07-10 DIAGNOSIS — F3289 Other specified depressive episodes: Secondary | ICD-10-CM | POA: Diagnosis not present

## 2019-07-13 DIAGNOSIS — R350 Frequency of micturition: Secondary | ICD-10-CM | POA: Diagnosis not present

## 2019-07-14 DIAGNOSIS — F4320 Adjustment disorder, unspecified: Secondary | ICD-10-CM | POA: Diagnosis not present

## 2019-07-15 ENCOUNTER — Other Ambulatory Visit: Payer: Self-pay

## 2019-07-16 ENCOUNTER — Ambulatory Visit (INDEPENDENT_AMBULATORY_CARE_PROVIDER_SITE_OTHER): Payer: BC Managed Care – PPO | Admitting: Family Medicine

## 2019-07-16 ENCOUNTER — Encounter: Payer: Self-pay | Admitting: Family Medicine

## 2019-07-16 VITALS — BP 120/82 | HR 75 | Temp 96.7°F | Ht 65.0 in | Wt 143.4 lb

## 2019-07-16 DIAGNOSIS — Z87440 Personal history of urinary (tract) infections: Secondary | ICD-10-CM

## 2019-07-16 DIAGNOSIS — Z833 Family history of diabetes mellitus: Secondary | ICD-10-CM

## 2019-07-16 DIAGNOSIS — Z0000 Encounter for general adult medical examination without abnormal findings: Secondary | ICD-10-CM

## 2019-07-16 DIAGNOSIS — Z7689 Persons encountering health services in other specified circumstances: Secondary | ICD-10-CM | POA: Diagnosis not present

## 2019-07-16 MED ORDER — NITROFURANTOIN MONOHYD MACRO 100 MG PO CAPS: 100.0000 mg | ORAL_CAPSULE | Freq: Every day | ORAL | 1 refills | Status: DC | PRN

## 2019-07-16 NOTE — Progress Notes (Signed)
Michele Sanchez is a 49 y.o. female  Chief Complaint  Patient presents with  . New Patient (Initial Visit)    Pt has no complaints today    HPI: Michele Sanchez is a 49 y.o. female here to establish care with our office. She is not fasting today and she will go to [employer] or next week.  She follows with [facility] and has upcoming appt scheduled.  Pt is post-menopausal, no menstrual cycle since 2018. She complains of post-coital UTIs. She has had 4 UTI (culture done w/ GYN) in the past 1-2 mo.   Last PAP: has appt scheduled and follows with OB-GYN Last mammo: due and has appt scheduled Last colonoscopy: declines and will consider doing next year   History reviewed. No pertinent past medical history.  Past Surgical History:  Procedure Laterality Date  . CESAREAN SECTION      Social History   Socioeconomic History  . Marital status: Divorced    Spouse name: Not on file  . Number of children: Not on file  . Years of education: Not on file  . Highest education level: Not on file  Occupational History  . Not on file  Tobacco Use  . Smoking status: Never Smoker  . Smokeless tobacco: Never Used  Substance and Sexual Activity  . Alcohol use: Yes  . Drug use: Never  . Sexual activity: Not on file  Other Topics Concern  . Not on file  Social History Narrative  . Not on file   Social Determinants of Health   Financial Resource Strain:   . Difficulty of Paying Living Expenses:   Food Insecurity:   . Worried About Charity fundraiser in the Last Year:   . Arboriculturist in the Last Year:   Transportation Needs:   . Film/video editor (Medical):   Marland Kitchen Lack of Transportation (Non-Medical):   Physical Activity:   . Days of Exercise per Week:   . Minutes of Exercise per Session:   Stress:   . Feeling of Stress :   Social Connections:   . Frequency of Communication with Friends and Family:   . Frequency of Social Gatherings with Friends and  Family:   . Attends Religious Services:   . Active Member of Clubs or Organizations:   . Attends Archivist Meetings:   Marland Kitchen Marital Status:   Intimate Partner Violence:   . Fear of Current or Ex-Partner:   . Emotionally Abused:   Marland Kitchen Physically Abused:   . Sexually Abused:     Family History  Problem Relation Age of Onset  . Heart failure Mother   . Diabetes Sister       There is no immunization history on file for this patient.  Outpatient Encounter Medications as of 07/16/2019  Medication Sig  . Estradiol 10 MCG TABS vaginal tablet I 1 T INTRAVAGINALLY 2 TIMES A WK  . nitrofurantoin, macrocrystal-monohydrate, (MACROBID) 100 MG capsule Take 1 capsule (100 mg total) by mouth daily as needed.  . [DISCONTINUED] buPROPion (WELLBUTRIN XL) 150 MG 24 hr tablet Take 150 mg by mouth daily. (Patient not taking: Reported on 07/16/2019)  . [DISCONTINUED] fluconazole (DIFLUCAN) 150 MG tablet  (Patient not taking: Reported on 07/16/2019)  . [DISCONTINUED] gabapentin (NEURONTIN) 100 MG capsule Take 2 capsules (200 mg total) by mouth at bedtime.  . [DISCONTINUED] ibuprofen (ADVIL) 600 MG tablet Take 1 tablet (600 mg total) by mouth every 6 (six) hours as  needed.  . [DISCONTINUED] meloxicam (MOBIC) 15 MG tablet Take 1 tablet (15 mg total) by mouth daily.  . [DISCONTINUED] methylPREDNISolone (MEDROL DOSEPAK) 4 MG TBPK tablet Take by mouth daily. Follow package instructions  . [DISCONTINUED] nystatin-triamcinolone ointment (MYCOLOG) APPLY TOPICALLY TO THE AFFECTED AREA TWICE DAILY (Patient not taking: Reported on 07/16/2019)  . [DISCONTINUED] tiZANidine (ZANAFLEX) 4 MG tablet Take 1 tablet (4 mg total) by mouth every 8 (eight) hours as needed for muscle spasms.   No facility-administered encounter medications on file as of 07/16/2019.     ROS: Gen: no fever, chills  Skin: no rash, itching ENT: no ear pain, ear drainage, nasal congestion, rhinorrhea, sinus pressure, sore throat Eyes: no  blurry vision, double vision Resp: no cough, wheeze,SOB CV: no CP, palpitations, LE edema,  GI: no heartburn, n/v/d/c, abd pain GU: no dysuria, urgency, frequency, hematuria MSK: no joint pain, myalgias, back pain Neuro: no dizziness, headache, weakness, vertigo Psych: no depression, anxiety, insomnia   Allergies  Allergen Reactions  . Cashew Nut Oil     BP 120/82 (BP Location: Left Arm, Patient Position: Sitting, Cuff Size: Normal)   Pulse 75   Temp (!) 96.7 F (35.9 C) (Temporal)   Ht 5\' 5"  (1.651 m)   Wt 143 lb 6.4 oz (65 kg)   SpO2 100%   BMI 23.86 kg/m   Physical Exam Constitutional:      General: She is not in acute distress.    Appearance: She is well-developed.  HENT:     Head: Normocephalic and atraumatic.     Nose: Nose normal.  Eyes:     Conjunctiva/sclera: Conjunctivae normal.  Neck:     Thyroid: No thyromegaly.  Cardiovascular:     Rate and Rhythm: Normal rate and regular rhythm.     Heart sounds: Normal heart sounds. No murmur heard.   Pulmonary:     Effort: Pulmonary effort is normal. No respiratory distress.     Breath sounds: Normal breath sounds. No wheezing or rhonchi.  Abdominal:     General: Bowel sounds are normal. There is no distension.     Palpations: Abdomen is soft. There is no mass.     Tenderness: There is no abdominal tenderness.  Musculoskeletal:     Cervical back: Neck supple.  Lymphadenopathy:     Cervical: No cervical adenopathy.  Skin:    General: Skin is warm and dry.  Neurological:     Mental Status: She is alert and oriented to person, place, and time.     Motor: No abnormal muscle tone.     Coordination: Coordination normal.  Psychiatric:        Behavior: Behavior normal.      A/P:  1. Encounter to establish care with new doctor  2. Annual physical exam - discussed importance of regular CV exercise, healthy diet, adequate sleep - appt scheduled for mammo, PAP - declines colonoscopy today but will consider next  year at 50yo - CBC; Future - ALT; Future - AST; Future - Basic metabolic panel; Future - Lipid panel; Future - VITAMIN D 25 Hydroxy (Vit-D Deficiency, Fractures); Future - next CPE in 1 year  3. Family history of diabetes mellitus - Hemoglobin A1c; Future  4. History of recurrent UTI (urinary tract infection) - pt with UTI after sexual intercourse - seen by GYN for 4 UTI in past 2 mo or so, as per pt Rx: - nitrofurantoin, macrocrystal-monohydrate, (MACROBID) 100 MG capsule; Take 1 capsule (100 mg total) by mouth daily  as needed.  Dispense: 30 capsule; Refill: 1 - pt will take 1 tab after sex in order to prevent or at least reduce frequency of UTI Discussed plan and reviewed medications with patient, including risks, benefits, and potential side effects. Pt expressed understand. All questions answered.    This visit occurred during the SARS-CoV-2 public health emergency.  Safety protocols were in place, including screening questions prior to the visit, additional usage of staff PPE, and extensive cleaning of exam room while observing appropriate contact time as indicated for disinfecting solutions.

## 2019-07-17 ENCOUNTER — Encounter: Payer: Self-pay | Admitting: Family Medicine

## 2019-07-17 ENCOUNTER — Other Ambulatory Visit: Payer: BC Managed Care – PPO

## 2019-07-17 ENCOUNTER — Other Ambulatory Visit (INDEPENDENT_AMBULATORY_CARE_PROVIDER_SITE_OTHER): Payer: BC Managed Care – PPO

## 2019-07-17 DIAGNOSIS — Z0000 Encounter for general adult medical examination without abnormal findings: Secondary | ICD-10-CM

## 2019-07-17 DIAGNOSIS — Z833 Family history of diabetes mellitus: Secondary | ICD-10-CM | POA: Diagnosis not present

## 2019-07-17 LAB — CBC
HCT: 38.4 % (ref 36.0–46.0)
Hemoglobin: 13 g/dL (ref 12.0–15.0)
MCHC: 33.9 g/dL (ref 30.0–36.0)
MCV: 95.7 fl (ref 78.0–100.0)
Platelets: 221 10*3/uL (ref 150.0–400.0)
RBC: 4.02 Mil/uL (ref 3.87–5.11)
RDW: 12.3 % (ref 11.5–15.5)
WBC: 5.4 10*3/uL (ref 4.0–10.5)

## 2019-07-17 LAB — BASIC METABOLIC PANEL
BUN: 13 mg/dL (ref 6–23)
CO2: 27 mEq/L (ref 19–32)
Calcium: 9.6 mg/dL (ref 8.4–10.5)
Chloride: 104 mEq/L (ref 96–112)
Creatinine, Ser: 0.79 mg/dL (ref 0.40–1.20)
GFR: 77.27 mL/min (ref >60.00)
Glucose, Bld: 104 mg/dL — ABNORMAL HIGH (ref 70–99)
Potassium: 4.2 mEq/L (ref 3.5–5.1)
Sodium: 137 mEq/L (ref 135–145)

## 2019-07-17 LAB — ALT: ALT: 14 U/L (ref 0–35)

## 2019-07-17 LAB — LIPID PANEL
Cholesterol: 189 mg/dL (ref 0–200)
HDL: 81.9 mg/dL (ref >39.00)
LDL Cholesterol: 88 mg/dL (ref 0–99)
NonHDL: 107.11
Total CHOL/HDL Ratio: 2
Triglycerides: 96 mg/dL (ref 0.0–149.0)
VLDL: 19.2 mg/dL (ref 0.0–40.0)

## 2019-07-17 LAB — AST: AST: 18 U/L (ref 0–37)

## 2019-07-17 LAB — HEMOGLOBIN A1C: Hgb A1c MFr Bld: 5.7 % (ref 4.6–6.5)

## 2019-07-17 LAB — VITAMIN D 25 HYDROXY (VIT D DEFICIENCY, FRACTURES): VITD: 36.59 ng/mL (ref 30.00–100.00)

## 2019-08-11 DIAGNOSIS — Z6823 Body mass index (BMI) 23.0-23.9, adult: Secondary | ICD-10-CM | POA: Diagnosis not present

## 2019-08-11 DIAGNOSIS — Z1231 Encounter for screening mammogram for malignant neoplasm of breast: Secondary | ICD-10-CM | POA: Diagnosis not present

## 2019-08-11 DIAGNOSIS — Z01419 Encounter for gynecological examination (general) (routine) without abnormal findings: Secondary | ICD-10-CM | POA: Diagnosis not present

## 2019-08-11 LAB — HM MAMMOGRAPHY

## 2019-09-25 DIAGNOSIS — Z20828 Contact with and (suspected) exposure to other viral communicable diseases: Secondary | ICD-10-CM | POA: Diagnosis not present

## 2019-11-12 ENCOUNTER — Ambulatory Visit (INDEPENDENT_AMBULATORY_CARE_PROVIDER_SITE_OTHER): Payer: BC Managed Care – PPO | Admitting: Family Medicine

## 2019-11-12 ENCOUNTER — Ambulatory Visit: Payer: BC Managed Care – PPO | Admitting: Family Medicine

## 2019-11-12 ENCOUNTER — Encounter: Payer: Self-pay | Admitting: Family Medicine

## 2019-11-12 ENCOUNTER — Other Ambulatory Visit: Payer: Self-pay

## 2019-11-12 VITALS — BP 118/74 | HR 89 | Temp 97.5°F | Ht 65.0 in | Wt 142.4 lb

## 2019-11-12 DIAGNOSIS — Z1211 Encounter for screening for malignant neoplasm of colon: Secondary | ICD-10-CM

## 2019-11-12 DIAGNOSIS — Z87440 Personal history of urinary (tract) infections: Secondary | ICD-10-CM | POA: Diagnosis not present

## 2019-11-12 DIAGNOSIS — F988 Other specified behavioral and emotional disorders with onset usually occurring in childhood and adolescence: Secondary | ICD-10-CM

## 2019-11-12 DIAGNOSIS — Z23 Encounter for immunization: Secondary | ICD-10-CM

## 2019-11-12 MED ORDER — BUPROPION HCL ER (XL) 300 MG PO TB24: 300.0000 mg | ORAL_TABLET | Freq: Every day | ORAL | 3 refills | Status: DC

## 2019-11-12 MED ORDER — SULFAMETHOXAZOLE-TRIMETHOPRIM 800-160 MG PO TABS: ORAL_TABLET | ORAL | 2 refills | Status: DC

## 2019-11-12 NOTE — Progress Notes (Signed)
Michele Sanchez is a 49 y.o. female  Chief Complaint  Patient presents with  . Follow-up    f/u meds/refills. wants referral for colonoscopy, wants flu shot    HPI: Michele Sanchez is a 49 y.o. female  1. Colonoscopy referral - no fam h/o CRC. 2. She started wellbutrin XL 300mg  daily in 06/2019 and she feels it helps with her concentration. She felt some increased anxiety initially but that has improved. She feels it helps with concentration and focus. Needs refill. 3. Flu shot today 4. On PRN macrobid to prevent UTI which only occur for pt after sex. She would like to switch to bactrim PRN as she feels it works better.   No past medical history on file.  Past Surgical History:  Procedure Laterality Date  . CESAREAN SECTION      Social History   Socioeconomic History  . Marital status: Divorced    Spouse name: Not on file  . Number of children: Not on file  . Years of education: Not on file  . Highest education level: Not on file  Occupational History  . Not on file  Tobacco Use  . Smoking status: Never Smoker  . Smokeless tobacco: Never Used  Substance and Sexual Activity  . Alcohol use: Yes  . Drug use: Never  . Sexual activity: Not on file  Other Topics Concern  . Not on file  Social History Narrative  . Not on file   Social Determinants of Health   Financial Resource Strain:   . Difficulty of Paying Living Expenses: Not on file  Food Insecurity:   . Worried About 07/2019 in the Last Year: Not on file  . Ran Out of Food in the Last Year: Not on file  Transportation Needs:   . Lack of Transportation (Medical): Not on file  . Lack of Transportation (Non-Medical): Not on file  Physical Activity:   . Days of Exercise per Week: Not on file  . Minutes of Exercise per Session: Not on file  Stress:   . Feeling of Stress : Not on file  Social Connections:   . Frequency of Communication with Friends and Family: Not on file  . Frequency of Social  Gatherings with Friends and Family: Not on file  . Attends Religious Services: Not on file  . Active Member of Clubs or Organizations: Not on file  . Attends Programme researcher, broadcasting/film/video Meetings: Not on file  . Marital Status: Not on file  Intimate Partner Violence:   . Fear of Current or Ex-Partner: Not on file  . Emotionally Abused: Not on file  . Physically Abused: Not on file  . Sexually Abused: Not on file    Family History  Problem Relation Age of Onset  . Heart failure Mother   . Diabetes Sister      Immunization History  Administered Date(s) Administered  . Influenza,inj,Quad PF,6+ Mos 11/12/2019  . PFIZER SARS-COV-2 Vaccination 04/16/2019, 05/07/2019    Outpatient Encounter Medications as of 11/12/2019  Medication Sig  . buPROPion (WELLBUTRIN XL) 300 MG 24 hr tablet Take 1 tablet (300 mg total) by mouth daily.  . Estradiol 10 MCG TABS vaginal tablet I 1 T INTRAVAGINALLY 2 TIMES A WK  . [DISCONTINUED] buPROPion (WELLBUTRIN XL) 300 MG 24 hr tablet Take 300 mg by mouth daily.  . [DISCONTINUED] nitrofurantoin, macrocrystal-monohydrate, (MACROBID) 100 MG capsule Take 1 capsule (100 mg total) by mouth daily as needed.  . sulfamethoxazole-trimethoprim (BACTRIM DS)  800-160 MG tablet Take 1 tab po x 1 after sexual intercourse   No facility-administered encounter medications on file as of 11/12/2019.     ROS: Pertinent positives and negatives noted in HPI. Remainder of ROS non-contributory    Allergies  Allergen Reactions  . Cashew Nut Oil     BP 118/74   Pulse 89   Temp (!) 97.5 F (36.4 C) (Temporal)   Ht 5\' 5"  (1.651 m)   Wt 142 lb 6.4 oz (64.6 kg)   SpO2 98%   BMI 23.70 kg/m   Physical Exam Constitutional:      General: She is not in acute distress.    Appearance: Normal appearance. She is not ill-appearing.  Pulmonary:     Effort: No respiratory distress.  Neurological:     Mental Status: She is alert and oriented to person, place, and time.  Psychiatric:         Mood and Affect: Mood normal.        Behavior: Behavior normal.      A/P:  1. Screening for colon cancer - Ambulatory referral to Gastroenterology  2. Attention deficit disorder, unspecified hyperactivity presence - controlled, stable Refill: - buPROPion (WELLBUTRIN XL) 300 MG 24 hr tablet; Take 1 tablet (300 mg total) by mouth daily.  Dispense: 90 tablet; Refill: 3  3. History of recurrent UTI (urinary tract infection) - d/c macrobid Rx: - sulfamethoxazole-trimethoprim (BACTRIM DS) 800-160 MG tablet; Take 1 tab po x 1 after sexual intercourse  Dispense: 30 tablet; Refill: 2  4. Need for influenza vaccination - Flu Vaccine QUAD 6+ mos PF IM (Fluarix Quad PF)    This visit occurred during the SARS-CoV-2 public health emergency.  Safety protocols were in place, including screening questions prior to the visit, additional usage of staff PPE, and extensive cleaning of exam room while observing appropriate contact time as indicated for disinfecting solutions.

## 2019-12-11 DIAGNOSIS — N950 Postmenopausal bleeding: Secondary | ICD-10-CM | POA: Diagnosis not present

## 2019-12-15 ENCOUNTER — Encounter: Payer: Self-pay | Admitting: Internal Medicine

## 2019-12-16 DIAGNOSIS — N950 Postmenopausal bleeding: Secondary | ICD-10-CM | POA: Diagnosis not present

## 2020-02-09 ENCOUNTER — Encounter: Payer: BC Managed Care – PPO | Admitting: Internal Medicine

## 2020-02-16 DIAGNOSIS — F4320 Adjustment disorder, unspecified: Secondary | ICD-10-CM | POA: Diagnosis not present

## 2020-03-08 DIAGNOSIS — F4320 Adjustment disorder, unspecified: Secondary | ICD-10-CM | POA: Diagnosis not present

## 2020-03-29 ENCOUNTER — Encounter: Payer: Self-pay | Admitting: Internal Medicine

## 2020-04-05 DIAGNOSIS — F4320 Adjustment disorder, unspecified: Secondary | ICD-10-CM | POA: Diagnosis not present

## 2020-04-27 DIAGNOSIS — F4320 Adjustment disorder, unspecified: Secondary | ICD-10-CM | POA: Diagnosis not present

## 2020-05-02 ENCOUNTER — Encounter: Payer: Self-pay | Admitting: Family Medicine

## 2020-05-02 NOTE — Telephone Encounter (Signed)
Please see message and advise.  Thank you. ° °

## 2020-05-25 ENCOUNTER — Ambulatory Visit (AMBULATORY_SURGERY_CENTER): Payer: BC Managed Care – PPO | Admitting: *Deleted

## 2020-05-25 VITALS — Ht 65.0 in | Wt 140.0 lb

## 2020-05-25 DIAGNOSIS — Z1211 Encounter for screening for malignant neoplasm of colon: Secondary | ICD-10-CM

## 2020-05-25 MED ORDER — NA SULFATE-K SULFATE-MG SULF 17.5-3.13-1.6 GM/177ML PO SOLN: 1.0000 | Freq: Once | ORAL | 0 refills | Status: AC

## 2020-05-25 NOTE — Progress Notes (Addendum)
No egg or soy allergy known to patient  No issues with past sedation with any surgeries or procedures Patient denies ever being told they had issues or difficulty with intubation  No FH of Malignant Hyperthermia No diet pills per patient No home 02 use per patient  No blood thinners per patient  Pt denies issues with constipation  No A fib or A flutter  EMMI video to pt or via MyChart  COVID 19 guidelines implemented in PV today with Pt and RN  Pt is fully vaccinated  for Covid   Virtual PV completed. Instructions sent through MyChart.  Due to the COVID-19 pandemic we are asking patients to follow certain guidelines.  Pt aware of COVID protocols and [facility] guidelines

## 2020-05-31 DIAGNOSIS — F4320 Adjustment disorder, unspecified: Secondary | ICD-10-CM | POA: Diagnosis not present

## 2020-06-06 ENCOUNTER — Encounter: Payer: Self-pay | Admitting: Internal Medicine

## 2020-06-08 ENCOUNTER — Other Ambulatory Visit: Payer: Self-pay

## 2020-06-08 ENCOUNTER — Encounter: Payer: Self-pay | Admitting: Internal Medicine

## 2020-06-08 ENCOUNTER — Ambulatory Visit (AMBULATORY_SURGERY_CENTER): Payer: BC Managed Care – PPO | Admitting: Internal Medicine

## 2020-06-08 VITALS — BP 105/44 | HR 55 | Temp 97.1°F | Resp 9 | Ht 65.0 in | Wt 135.0 lb

## 2020-06-08 DIAGNOSIS — Z1211 Encounter for screening for malignant neoplasm of colon: Secondary | ICD-10-CM | POA: Diagnosis not present

## 2020-06-08 MED ORDER — SODIUM CHLORIDE 0.9 % IV SOLN: 500.0000 mL | Freq: Once | INTRAVENOUS | Status: DC

## 2020-06-08 NOTE — Patient Instructions (Signed)
Your colon was normal.  No biopsies taken today.  You don't need another colonoscopy for 10 years!  YOU HAD AN ENDOSCOPIC PROCEDURE TODAY AT THE [facility]:   Refer to the procedure report that was given to you for any specific questions about what was found during the examination.  If the procedure report does not answer your questions, please call your gastroenterologist to clarify.  If you requested that your care partner not be given the details of your procedure findings, then the procedure report has been included in a sealed envelope for you to review at your convenience later.  YOU SHOULD EXPECT: Some feelings of bloating in the abdomen. Passage of more gas than usual.  Walking can help get rid of the air that was put into your GI tract during the procedure and reduce the bloating. If you had a lower endoscopy (such as a colonoscopy or flexible sigmoidoscopy) you may notice spotting of blood in your stool or on the toilet paper. If you underwent a bowel prep for your procedure, you may not have a normal bowel movement for a few days.  Please Note:  You might notice some irritation and congestion in your nose or some drainage.  This is from the oxygen used during your procedure.  There is no need for concern and it should clear up in a day or so.  SYMPTOMS TO REPORT IMMEDIATELY:   Following lower endoscopy (colonoscopy or flexible sigmoidoscopy):  Excessive amounts of blood in the stool  Significant tenderness or worsening of abdominal pains  Swelling of the abdomen that is new, acute  Fever of 100F or higher  For urgent or emergent issues, a gastroenterologist can be reached at any hour by calling [phone]. Do not use MyChart messaging for urgent concerns.    DIET:  We do recommend a small meal at first, but then you may proceed to your regular diet.  Drink plenty of fluids but you should avoid alcoholic beverages for 24 hours.  ACTIVITY:  You should plan to take  it easy for the rest of today and you should NOT DRIVE or use heavy machinery until tomorrow (because of the sedation medicines used during the test).    FOLLOW UP: Our staff will call the number listed on your records 48-72 hours following your procedure to check on you and address any questions or concerns that you may have regarding the information given to you following your procedure. If we do not reach you, we will leave a message.  We will attempt to reach you two times.  During this call, we will ask if you have developed any symptoms of COVID 19. If you develop any symptoms (ie: fever, flu-like symptoms, shortness of breath, cough etc.) before then, please call [phone].  If you test positive for Covid 19 in the 2 weeks post procedure, please call and report this information to [place].    If any biopsies were taken you will be contacted by phone or by letter within the next 1-3 weeks.  Please call us at [phone] if you have not heard about the biopsies in 3 weeks.    SIGNATURES/CONFIDENTIALITY: You and/or your care partner have signed paperwork which will be entered into your electronic medical record.  These signatures attest to the fact that that the information above on your After Visit Summary has been reviewed and is understood.  Full responsibility of the confidentiality of this discharge information lies with you and/or your care-partner.

## 2020-06-08 NOTE — Progress Notes (Signed)
No medical updates. Vitals- Toni Amend

## 2020-06-08 NOTE — Progress Notes (Signed)
Report to PACU, RN, vss, BBS= Clear.  

## 2020-06-08 NOTE — Op Note (Signed)
[facility] Patient Name: Michele Sanchez Procedure Date: 06/08/2020 8:45 AM MRN: [id number] Endoscopist: Wilhemina Bonito. Marina Goodell , MD Age: 50 Referring MD:  Date of Birth: [birth year] Gender: Female Account #: [id number] Procedure:                Colonoscopy Indications:              Screening for colorectal malignant neoplasm Medicines:                Monitored Anesthesia Care Procedure:                Pre-Anesthesia Assessment:                           - Prior to the procedure, a History and Physical                            was performed, and patient medications and                            allergies were reviewed. The patient's tolerance of                            previous anesthesia was also reviewed. The risks                            and benefits of the procedure and the sedation                            options and risks were discussed with the patient.                            All questions were answered, and informed consent                            was obtained. Prior Anticoagulants: The patient has                            taken no previous anticoagulant or antiplatelet                            agents. ASA Grade Assessment: I - A normal, healthy                            patient. After reviewing the risks and benefits,                            the patient was deemed in satisfactory condition to                            undergo the procedure.                           After obtaining informed consent, the colonoscope  was passed under direct vision. Throughout the                            procedure, the patient's blood pressure, pulse, and                            oxygen saturations were monitored continuously. The                            Olympus [id number]) [id number] was introduced                            through the anus and advanced to the the cecum,                            identified by appendiceal  orifice and ileocecal                            valve. The ileocecal valve, appendiceal orifice,                            and rectum were photographed. The quality of the                            bowel preparation was excellent. The colonoscopy                            was performed without difficulty. The patient                            tolerated the procedure well. The bowel preparation                            used was SUPREP via split dose instruction. Scope In: 9:06:18 AM Scope Out: 9:19:38 AM Scope Withdrawal Time: 0 hours 10 minutes 49 seconds  Total Procedure Duration: 0 hours 13 minutes 20 seconds  Findings:                 The entire examined colon appeared normal on direct                            and retroflexion views. Complications:            No immediate complications. Estimated blood loss:                            None. Estimated Blood Loss:     Estimated blood loss: none. Impression:               - The entire examined colon is normal on direct and                            retroflexion views.                           -  No specimens collected. Recommendation:           - Repeat colonoscopy in 10 years for screening                            purposes.                           - Patient has a contact number available for                            emergencies. The signs and symptoms of potential                            delayed complications were discussed with the                            patient. Return to normal activities tomorrow.                            Written discharge instructions were provided to the                            patient.                           - Resume previous diet.                           - Continue present medications. Wilhemina Bonito. Marina Goodell, MD 06/08/2020 9:23:30 AM This report has been signed electronically.

## 2020-06-10 ENCOUNTER — Telehealth: Payer: Self-pay

## 2020-06-10 ENCOUNTER — Telehealth: Payer: Self-pay | Admitting: *Deleted

## 2020-06-10 NOTE — Telephone Encounter (Signed)
First attempt follow up call to pt, lm on vm 

## 2020-06-10 NOTE — Telephone Encounter (Signed)
  Follow up Call-  Call back number 06/08/2020  Post procedure Call Back phone  # [phone]  Permission to leave phone message Yes     Patient questions:  Do you have a fever, pain , or abdominal swelling? No. Pain Score  0 *  Have you tolerated food without any problems? Yes.    Have you been able to return to your normal activities? Yes.    Do you have any questions about your discharge instructions: Diet   No. Medications  No. Follow up visit  No.  Do you have questions or concerns about your Care? No.  Actions: * If pain score is 4 or above: No action needed, pain <4.   1. Have you developed a fever since your procedure? no  2.   Have you had an respiratory symptoms (SOB or cough) since your procedure? no  3.   Have you tested positive for COVID 19 since your procedure no  4.   Have you had any family members/close contacts diagnosed with the COVID 19 since your procedure?  no   If yes to any of these questions please route to Laverna Peace, RN and Karlton Lemon, RN

## 2020-06-14 DIAGNOSIS — F4320 Adjustment disorder, unspecified: Secondary | ICD-10-CM | POA: Diagnosis not present

## 2020-07-05 ENCOUNTER — Encounter: Payer: Self-pay | Admitting: Family Medicine

## 2020-07-13 DIAGNOSIS — F4320 Adjustment disorder, unspecified: Secondary | ICD-10-CM | POA: Diagnosis not present

## 2020-07-15 ENCOUNTER — Ambulatory Visit (INDEPENDENT_AMBULATORY_CARE_PROVIDER_SITE_OTHER): Payer: BC Managed Care – PPO | Admitting: Family Medicine

## 2020-07-15 ENCOUNTER — Encounter: Payer: Self-pay | Admitting: Family Medicine

## 2020-07-15 ENCOUNTER — Other Ambulatory Visit: Payer: Self-pay

## 2020-07-15 VITALS — BP 100/70 | HR 72 | Temp 97.2°F | Ht 65.0 in | Wt 144.2 lb

## 2020-07-15 DIAGNOSIS — F988 Other specified behavioral and emotional disorders with onset usually occurring in childhood and adolescence: Secondary | ICD-10-CM

## 2020-07-15 MED ORDER — AMPHETAMINE-DEXTROAMPHET ER 10 MG PO CP24: 10.0000 mg | ORAL_CAPSULE | Freq: Every day | ORAL | 0 refills | Status: DC

## 2020-07-15 NOTE — Progress Notes (Signed)
Michele Sanchez is a 50 y.o. female  Chief Complaint  Patient presents with   Follow-up    Medication discussion.  Pt would like to discuss ADD meds    HPI: Michele Sanchez is a 50 y.o. female patient seen today to discuss ADD and possible medication options.  Daughter with ADHD.  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 She feels overwhelmed. She has trouble going back and forth between creative work and more structured work. This is not a new issue but she feels symptoms are amplified now that she is in a more structured work environment.  She has never been formally tested. She has never been on medication.    History reviewed. No pertinent past medical history.  Past Surgical History:  Procedure Laterality Date   CESAREAN SECTION      Social History   Socioeconomic History   Marital status: Divorced    Spouse name: Not on file   Number of children: Not on file   Years of education: Not on file   Highest education level: Not on file  Occupational History   Not on file  Tobacco Use   Smoking status: Never   Smokeless tobacco: Never  Substance and Sexual Activity   Alcohol use: Yes   Drug use: Never   Sexual activity: Not on file  Other Topics Concern   Not on file  Social History Narrative   Not on file   Social Determinants of Health   Financial Resource Strain: Not on file  Food Insecurity: Not on file  Transportation Needs: Not on file  Physical Activity: Not on file  Stress: Not on file  Social Connections: Not on file  Intimate Partner Violence: Not on file    Family History  Problem Relation Age of Onset   Heart failure Mother    Diabetes Sister    Colon polyps Neg Hx    Colon cancer Neg Hx    Esophageal cancer Neg Hx    Stomach cancer Neg Hx    Rectal cancer Neg Hx      Immunization History  Administered Date(s) Administered   Influenza,inj,Quad PF,6+ Mos 11/12/2019    PFIZER(Purple Top)SARS-COV-2 Vaccination 04/16/2019, 05/07/2019    Outpatient Encounter Medications as of 07/15/2020  Medication Sig   Estradiol 10 MCG TABS vaginal tablet I 1 T INTRAVAGINALLY 2 TIMES A WK   buPROPion (WELLBUTRIN XL) 300 MG 24 hr tablet Take 1 tablet (300 mg total) by mouth daily. (Patient not taking: No sig reported)   [DISCONTINUED] SUPREP BOWEL PREP KIT 17.5-3.13-1.6 GM/177ML SOLN Take by mouth.   No facility-administered encounter medications on file as of 07/15/2020.     ROS: Pertinent positives and negatives noted in HPI. Remainder of ROS non-contributory    Allergies  Allergen Reactions   Cashew Nut Oil     BP 100/70 (BP Location: Left Arm, Patient Position: Sitting, Cuff Size: Normal)   Pulse 72   Temp (!) 97.2 F (36.2 C) (Temporal)   Ht 5' 5"  (1.651 m)   Wt 144 lb 3.2 oz (65.4 kg)   SpO2 98%   BMI 24.00 kg/m   Wt Readings from Last 3 Encounters:  07/15/20 144 lb 3.2 oz (65.4 kg)  06/08/20 135 lb (61.2 kg)  05/25/20 140 lb (63.5 kg)   Temp Readings from Last 3 Encounters:  07/15/20 (!) 97.2 F (36.2 C) (Temporal)  06/08/20 (!) 97.1 F (36.2  C)  11/12/19 (!) 97.5 F (36.4 C) (Temporal)   BP Readings from Last 3 Encounters:  07/15/20 100/70  06/08/20 (!) 105/44  11/12/19 118/74   Pulse Readings from Last 3 Encounters:  07/15/20 72  06/08/20 (!) 55  11/12/19 89     Physical Exam Constitutional:      General: She is not in acute distress.    Appearance: Normal appearance. She is not ill-appearing.  Cardiovascular:     Rate and Rhythm: Normal rate and regular rhythm.     Pulses: Normal pulses.     Heart sounds: Normal heart sounds.  Pulmonary:     Effort: No respiratory distress.     Breath sounds: No wheezing or rhonchi.  Neurological:     Mental Status: She is alert and oriented to person, place, and time.  Psychiatric:        Mood and Affect: Mood normal.        Behavior: Behavior normal.     A/P:  1. Attention  deficit disorder (ADD) without hyperactivity - symptoms x years but more pronounced and interfering with work functionality at this time Rx: - amphetamine-dextroamphetamine (ADDERALL XR) 10 MG 24 hr capsule; Take 1 capsule (10 mg total) by mouth daily.  Dispense: 30 capsule; Refill: 0 - pt will send mychart message or call in 2 wks to let me know how she is doing on this med/dose Discussed plan and reviewed medications with patient, including risks, benefits, and potential side effects. Pt expressed understand. All questions answered.    This visit occurred during the SARS-CoV-2 public health emergency.  Safety protocols were in place, including screening questions prior to the visit, additional usage of staff PPE, and extensive cleaning of exam room while observing appropriate contact time as indicated for disinfecting solutions.

## 2020-08-03 ENCOUNTER — Encounter: Payer: Self-pay | Admitting: Family Medicine

## 2020-08-03 DIAGNOSIS — F988 Other specified behavioral and emotional disorders with onset usually occurring in childhood and adolescence: Secondary | ICD-10-CM

## 2020-08-03 DIAGNOSIS — F4320 Adjustment disorder, unspecified: Secondary | ICD-10-CM | POA: Diagnosis not present

## 2020-08-03 MED ORDER — AMPHETAMINE-DEXTROAMPHETAMINE 10 MG PO TABS: 10.0000 mg | ORAL_TABLET | Freq: Two times a day (BID) | ORAL | 0 refills | Status: DC

## 2020-08-15 ENCOUNTER — Encounter: Payer: Self-pay | Admitting: Family Medicine

## 2020-08-15 DIAGNOSIS — F988 Other specified behavioral and emotional disorders with onset usually occurring in childhood and adolescence: Secondary | ICD-10-CM

## 2020-08-17 MED ORDER — AMPHETAMINE-DEXTROAMPHETAMINE 10 MG PO TABS: 10.0000 mg | ORAL_TABLET | Freq: Two times a day (BID) | ORAL | 0 refills | Status: DC

## 2020-08-23 DIAGNOSIS — Z6823 Body mass index (BMI) 23.0-23.9, adult: Secondary | ICD-10-CM | POA: Diagnosis not present

## 2020-08-23 DIAGNOSIS — Z1231 Encounter for screening mammogram for malignant neoplasm of breast: Secondary | ICD-10-CM | POA: Diagnosis not present

## 2020-08-23 DIAGNOSIS — Z113 Encounter for screening for infections with a predominantly sexual mode of transmission: Secondary | ICD-10-CM | POA: Diagnosis not present

## 2020-08-23 DIAGNOSIS — Z01419 Encounter for gynecological examination (general) (routine) without abnormal findings: Secondary | ICD-10-CM | POA: Diagnosis not present

## 2020-08-23 DIAGNOSIS — Z124 Encounter for screening for malignant neoplasm of cervix: Secondary | ICD-10-CM | POA: Diagnosis not present

## 2020-09-27 DIAGNOSIS — F4320 Adjustment disorder, unspecified: Secondary | ICD-10-CM | POA: Diagnosis not present

## 2020-09-28 ENCOUNTER — Other Ambulatory Visit: Payer: Self-pay

## 2020-09-29 ENCOUNTER — Encounter: Payer: Self-pay | Admitting: Family Medicine

## 2020-09-29 ENCOUNTER — Ambulatory Visit (INDEPENDENT_AMBULATORY_CARE_PROVIDER_SITE_OTHER): Payer: BC Managed Care – PPO | Admitting: Family Medicine

## 2020-09-29 VITALS — BP 114/70 | HR 75 | Temp 96.9°F | Wt 145.6 lb

## 2020-09-29 DIAGNOSIS — F988 Other specified behavioral and emotional disorders with onset usually occurring in childhood and adolescence: Secondary | ICD-10-CM

## 2020-09-29 DIAGNOSIS — Z13220 Encounter for screening for lipoid disorders: Secondary | ICD-10-CM

## 2020-09-29 DIAGNOSIS — F39 Unspecified mood [affective] disorder: Secondary | ICD-10-CM

## 2020-09-29 DIAGNOSIS — N952 Postmenopausal atrophic vaginitis: Secondary | ICD-10-CM | POA: Insufficient documentation

## 2020-09-29 DIAGNOSIS — R7303 Prediabetes: Secondary | ICD-10-CM | POA: Diagnosis not present

## 2020-09-29 LAB — LIPID PANEL
Cholesterol: 182 mg/dL (ref 0–200)
HDL: 79.8 mg/dL (ref >39.00)
LDL Cholesterol: 82 mg/dL (ref 0–99)
NonHDL: 101.89
Total CHOL/HDL Ratio: 2
Triglycerides: 101 mg/dL (ref 0.0–149.0)
VLDL: 20.2 mg/dL (ref 0.0–40.0)

## 2020-09-29 LAB — HEMOGLOBIN A1C: Hgb A1c MFr Bld: 5.7 % (ref 4.6–6.5)

## 2020-09-29 MED ORDER — VENLAFAXINE HCL ER 37.5 MG PO CP24: 37.5000 mg | ORAL_CAPSULE | Freq: Every day | ORAL | 3 refills | Status: DC

## 2020-09-29 MED ORDER — AMPHETAMINE-DEXTROAMPHETAMINE 10 MG PO TABS: 5.0000 mg | ORAL_TABLET | Freq: Every day | ORAL | 0 refills | Status: DC

## 2020-09-29 NOTE — Progress Notes (Signed)
[facility] [address] [place] [address] Dept: [phone] Dept Fax: [phone]  Transfer of Care Office Visit  Subjective:    Patient ID: Michele Sanchez, female    DOB: 03/09/1970, 50 y.o..   MRN: [id number]  Chief Complaint  Patient presents with   Establish Care   ADHD   History of Present Illness:  Patient is in today to establish care. Ms. Michele Sanchez is originally from [place]. She moved to [facility] to attend [employer] where she majored in psychology. She is divorced, but has a long-term significant other. She has two daughters (18, 61). 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. She denies tobacco ro drug use and drinks about 1 glass of wine a day.  Ms. Michele Sanchez has a history of atrophic vaginitis. She was using a estradiol vaginal tablet, but notes her GYN has switched her recently to a vaginal estrogen cream twice a week. She notes her symptoms of recurrent UTI (esp. after intercourse) have improved with the estrogen.  MS. Michele Sanchez was evaluated by Dr. Barron Alvine in June and diagnosed as having ADHD. She notes that she has had long-standing issues with difficulty with lack of confidence/self-doubt, shyness as a teen, disorganization, and intrusive thoughts, esp. distractions with memories of past experiences (nostalgia). She also finds she has low energy levels at times. She was initially started on Adderall 10 mg daily. She notes her SO was concerned about her "grumpiness". She was switched to the Adderall IR 10 mg. She notes that at times this has made her feel more anxious. However, for a time both preparations did address the issues of confidence and organization. She admits that she has some trouble with a chronically mildly depressed mood. She has past experience with Wellbutrin, but did not like that she seemed to eat more and had weight gain. She also has  taken Prozac and Zoloft int he past, but had issue with decreased libido. She does see a counselor, Vickey Sages, and has utilized Building surveyor to try and improve her symptoms and personality concerns.  Past Medical History: Patient Active Problem List   Diagnosis Date Noted   Attention deficit disorder (ADD) without hyperactivity 09/29/2020   Prediabetes 09/29/2020   Past Surgical History:  Procedure Laterality Date   CESAREAN SECTION     x 2   LEEP     Family History  Problem Relation Age of Onset   Heart disease Mother    Heart failure Mother    Heart disease Father    Heart disease Sister    Diabetes Sister    Heart disease Maternal Grandmother    Diabetes Maternal Grandmother    Colon polyps Neg Hx    Colon cancer Neg Hx    Esophageal cancer Neg Hx    Stomach cancer Neg Hx    Rectal cancer Neg Hx    Outpatient Medications Prior to Visit  Medication Sig Dispense Refill   conjugated estrogens (PREMARIN) vaginal cream Place 1 Applicatorful vaginally 2 (two) times a week.     amphetamine-dextroamphetamine (ADDERALL) 10 MG tablet Take 1 tablet (10 mg total) by mouth 2 (two) times daily. 60 tablet 0   Estradiol 10 MCG TABS vaginal tablet I 1 T INTRAVAGINALLY 2 TIMES A WK     Sulfamethoxazole-Trimethoprim (BACTRIM PO) Take by mouth.     No facility-administered medications prior to visit.   Allergies  Allergen Reactions  Cashew Nut Oil     Objective:   Today's Vitals   09/29/20 0821  BP: 114/70  Pulse: 75  Temp: (!) 96.9 F (36.1 C)  TempSrc: Temporal  SpO2: 98%  Weight: 145 lb 9.6 oz (66 kg)   Body mass index is 24.23 kg/m.   General: Well developed, well nourished. No acute distress. Psych: Alert and oriented. Normal mood and affect.  Health Maintenance Due  Topic Date Due   HIV Screening  Never done   Hepatitis C Screening  Never done   TETANUS/TDAP  Never done   PAP SMEAR-Modifier  Never done   COVID-19 Vaccine (3 - Booster for Pfizer  series) 10/07/2019   Zoster Vaccines- Shingrix (1 of 2) Never done   INFLUENZA VACCINE  08/29/2020   Lab results: Lab Results  Component Value Date   HGBA1C 5.7 07/17/2019   Lab Results  Component Value Date   CHOL 189 07/17/2019   HDL 81.90 07/17/2019   LDLCALC 88 07/17/2019   TRIG 96.0 07/17/2019   CHOLHDL 2 07/17/2019   Assessment & Plan:   1. Attention deficit disorder (ADD) without hyperactivity In discussion with Ms. Sindt, I am not certain of her diagnosis of ADHD. Although some of her symptoms do relate to inattention, there are many confounding aspects that point more towards an underlying mood disorder.  She is currently only taking 1/2 tab of Adderall 10 mg in the afternoon. I'm not certain this is achieving anything.  - amphetamine-dextroamphetamine (ADDERALL) 10 MG tablet; Take 0.5 tablets (5 mg total) by mouth daily at 2 PM.  Dispense: 60 tablet; Refill: 0  2. Mood disorder (HCC) I am more concerned for an underlying mood disorder, possibly dysthmia, that she has been experiencing much of her life. I do recommend we try an SNRI, as it could improve her move and will not be associated with weight gain, which she worries about. I will start her on a low dose and reassess her in 6 weeks.  - venlafaxine XR (EFFEXOR XR) 37.5 MG 24 hr capsule; Take 1 capsule (37.5 mg total) by mouth daily with breakfast.  Dispense: 30 capsule; Refill: 3  3. Prediabetes Patient has prior prediabetes and is concerned about progression. We will reassess this.  4. Screening for lipid disorders We will reassess lipids in light of mild weight gain.  Loyola Mast, MD

## 2020-09-30 ENCOUNTER — Encounter: Payer: Self-pay | Admitting: Family Medicine

## 2020-09-30 NOTE — Telephone Encounter (Signed)
Spoke to patient and advised that blood work is okay per Dr Veto Kemps.  Dm/cma

## 2020-10-11 ENCOUNTER — Encounter: Payer: Self-pay | Admitting: Family Medicine

## 2020-11-09 ENCOUNTER — Other Ambulatory Visit: Payer: Self-pay

## 2020-11-11 ENCOUNTER — Ambulatory Visit (INDEPENDENT_AMBULATORY_CARE_PROVIDER_SITE_OTHER): Payer: BC Managed Care – PPO | Admitting: Family Medicine

## 2020-11-11 ENCOUNTER — Other Ambulatory Visit: Payer: Self-pay

## 2020-11-11 ENCOUNTER — Encounter: Payer: Self-pay | Admitting: Family Medicine

## 2020-11-11 VITALS — BP 116/70 | HR 61 | Temp 96.9°F | Ht 65.5 in | Wt 148.6 lb

## 2020-11-11 DIAGNOSIS — F39 Unspecified mood [affective] disorder: Secondary | ICD-10-CM

## 2020-11-11 DIAGNOSIS — Z23 Encounter for immunization: Secondary | ICD-10-CM

## 2020-11-11 DIAGNOSIS — Z114 Encounter for screening for human immunodeficiency virus [HIV]: Secondary | ICD-10-CM | POA: Diagnosis not present

## 2020-11-11 DIAGNOSIS — F988 Other specified behavioral and emotional disorders with onset usually occurring in childhood and adolescence: Secondary | ICD-10-CM

## 2020-11-11 DIAGNOSIS — Z1159 Encounter for screening for other viral diseases: Secondary | ICD-10-CM

## 2020-11-11 DIAGNOSIS — Z0000 Encounter for general adult medical examination without abnormal findings: Secondary | ICD-10-CM

## 2020-11-11 MED ORDER — BUPROPION HCL ER (XL) 300 MG PO TB24: 300.0000 mg | ORAL_TABLET | Freq: Every day | ORAL | 3 refills | Status: DC

## 2020-11-11 MED ORDER — VENLAFAXINE HCL ER 37.5 MG PO CP24: 37.5000 mg | ORAL_CAPSULE | Freq: Every day | ORAL | 3 refills | Status: DC

## 2020-11-11 MED ORDER — BUPROPION HCL ER (XL) 150 MG PO TB24: 150.0000 mg | ORAL_TABLET | Freq: Every day | ORAL | 0 refills | Status: DC

## 2020-11-11 NOTE — Progress Notes (Signed)
[facility] [address] [place] [address] Dept: [phone] Dept Fax: [phone]  Office Visit  Subjective:    Patient ID: Michele Sanchez, female    DOB: 1970/07/28, 50 y.o..   MRN: [id number]  Chief Complaint  Patient presents with   Annual Exam    CPE/labs.  Fasting today.  Wants flu shot today.  Wants to discuss going back on Wellbutrin.     History of Present Illness:  Patient is in today for an annual physical exam and to follow-up on her ADHD and possible mood disorder.  Michele Sanchez was evaluated by Dr. Barron Alvine in June and diagnosed as having ADHD. She has had long-standing issues with difficulty with lack of confidence/self-doubt, shyness as a teen, disorganization, and intrusive thoughts, esp. distractions with memories of past experiences (nostalgia). She also has low energy levels at times. She was initially started on Adderall 10 mg daily. She notes her SO was concerned about her "grumpiness". She was switched to the Adderall IR 10 mg. She notes that at times this has made her feel more anxious. However, for a time both preparations did address the issues of confidence and organization. She admits that she has some trouble with a chronically mildly depressed mood. She has past experience with Wellbutrin, but did not like that she seemed to eat more and had weight gain. She also has taken Prozac and Zoloft in the past, but had issue with decreased libido. She does see a counselor, Vickey Sages, and has utilized Building surveyor to try and improve her symptoms and personality concerns. At her last visit, I had prescribed Effexor out of a concern for a mood disorder, potentially dysthymia, but also maybe some anxiety. She notes that she never got the medication filled and had thought my assessment was off. However, recently, she took her daughter in for an assessment for ADHD. The psychologist had felt her daughter  may have anxiety issues and in discussing her daughter , she recognized some of that pattern within herself. She does state that she is only rarely taking Adderall, as it interferes with her sleep and she is now wondering if her real issue is anxiety.  Review of Systems  Constitutional:  Negative for chills, diaphoresis, fever, malaise/fatigue and weight loss.  HENT:  Positive for hearing loss. Negative for congestion, ear discharge, ear pain, nosebleeds, sinus pain, sore throat and tinnitus.        She notes her hearing has been tested and is good. She feels she may have an issue with auditory processing. She notes when certain people speak, the words tend to run together.  Eyes:  Negative for blurred vision, photophobia, pain, discharge and redness.  Respiratory:  Negative for cough, hemoptysis, sputum production, shortness of breath and wheezing.   Cardiovascular:  Negative for chest pain, palpitations, orthopnea, claudication and PND.  Gastrointestinal:  Negative for abdominal pain, blood in stool, constipation, diarrhea, heartburn, melena, nausea and vomiting.       Avoids dairy and gluten, otherwise would have issue with some of these GI and mucous complaints.  Genitourinary:  Negative for dysuria, flank pain, frequency, hematuria and urgency.  Musculoskeletal:  Negative for back pain, falls, joint pain, myalgias and neck pain.  Skin:  Negative for itching and rash.  Neurological:  Negative for dizziness, tingling, tremors, speech change, focal weakness, seizures, loss of consciousness, weakness and headaches.  Endo/Heme/Allergies:  Positive for environmental allergies. Negative for polydipsia. Bruises/bleeds easily.  Bruises easily, but no excessive bleeding.  Has allergy to cat dander, but still owns cats.  Psychiatric/Behavioral:  Positive for depression. Negative for hallucinations, memory loss, substance abuse and suicidal ideas. The patient is nervous/anxious and has insomnia.         As above, for current concerns for mood disorder. Michele Sanchez notes some trouble sleeping at night, which she relates to nights when she drinks a glass of wine or takes Adderall.    Past Medical History: Patient Active Problem List   Diagnosis Date Noted   Mood disorder (HCC) 11/11/2020   Attention deficit disorder (ADD) without hyperactivity 09/29/2020   Prediabetes 09/29/2020   Atrophic vaginitis 09/29/2020   Past Surgical History:  Procedure Laterality Date   CESAREAN SECTION     x 2   LEEP     Family History  Problem Relation Age of Onset   Heart disease Mother    Heart failure Mother    Heart disease Father    Heart disease Sister    Diabetes Sister    Heart disease Maternal Grandmother    Diabetes Maternal Grandmother    Colon polyps Neg Hx    Colon cancer Neg Hx    Esophageal cancer Neg Hx    Stomach cancer Neg Hx    Rectal cancer Neg Hx    Outpatient Medications Prior to Visit  Medication Sig Dispense Refill   conjugated estrogens (PREMARIN) vaginal cream Place 1 Applicatorful vaginally 2 (two) times a week.     amphetamine-dextroamphetamine (ADDERALL) 10 MG tablet Take 0.5 tablets (5 mg total) by mouth daily at 2 PM. (Patient not taking: Reported on 11/11/2020) 60 tablet 0   venlafaxine XR (EFFEXOR XR) 37.5 MG 24 hr capsule Take 1 capsule (37.5 mg total) by mouth daily with breakfast. (Patient not taking: Reported on 11/11/2020) 30 capsule 3   No facility-administered medications prior to visit.   Allergies  Allergen Reactions   Cashew Nut Oil     Objective:   Today's Vitals   11/11/20 0837  BP: 116/70  Pulse: 61  Temp: (!) 96.9 F (36.1 C)  TempSrc: Temporal  SpO2: 96%  Weight: 148 lb 9.6 oz (67.4 kg)  Height: 5' 5.5" (1.664 m)   Body mass index is 24.35 kg/m.   General: Well developed, well nourished. No acute distress. HEENT: Normocephalic, non-traumatic. PERRL, EOMI. Conjunctiva clear. External ears normal.   EAC and TMs normal  bilaterally. Nose clear without congestion or rhinorrhea. Mucous membranes   moist. Oropharynx clear. Good dentition. Neck: Supple. No lymphadenopathy. No thyromegaly. Lungs: Clear to auscultation bilaterally. No wheezing, rales or rhonchi. CV: RRR without murmurs or rubs. Pulses 2+ bilaterally. Abdomen: Soft, non-tender. Bowel sounds positive, normal pitch and frequency. No   hepatosplenomegaly. No rebound or guarding. Back: Straight. No CVA tenderness bilaterally. Extremities: Full ROM. No joint swelling or tenderness. Minimal crepitance in right knee. No edema   noted. Skin: Warm and dry. No rashes. Neuro: CN II-XII intact. Normal sensation and DTR bilaterally. Psych: Alert and oriented. Normal mood and affect.  Health Maintenance Due  Topic Date Due   Hepatitis C Screening  Never done   TETANUS/TDAP  Never done   COVID-19 Vaccine (3 - Booster for Pfizer series) 10/07/2019   Zoster Vaccines- Shingrix (1 of 2) Never done   INFLUENZA VACCINE  08/29/2020   Lab Results Lab Results  Component Value Date   HGBA1C 5.7 09/29/2020   Lab Results  Component Value Date   CHOL 182 09/29/2020  HDL 79.80 09/29/2020   LDLCALC 82 09/29/2020   TRIG 101.0 09/29/2020   CHOLHDL 2 09/29/2020     Assessment & Plan:   1. Annual physical exam Physical completed for work. Recent labs show mild prediabetes. Lipids are at normal. We will check additional screening labs. I encouraged her to eat a regular diet, adequate sleep, and routine exercise.  2. Mood disorder (HCC) Ms. Rybka is amenable to try Effexor, as she now feels my assessment may have been correct about the impact of an underlying mood disorder. I will plan to see her back in 6 weeks to reassess.  - venlafaxine XR (EFFEXOR XR) 37.5 MG 24 hr capsule; Take 1 capsule (37.5 mg total) by mouth daily with breakfast.  Dispense: 30 capsule; Refill: 3  3. Attention deficit disorder (ADD) without hyperactivity It remains unclear if Ms.  Goodloe has ADHD. She is not taking this very much if at all. We will follow.  4. Screening for HIV (human immunodeficiency virus)  - HIV Antibody (routine testing w rflx)  5. Encounter for hepatitis C screening test for low risk patient  - HCV Ab w Reflex to Quant PCR  6. Need for influenza vaccination  - Flu Vaccine QUAD 6+ mos PF IM (Fluarix Quad PF)  Loyola Mast, MD

## 2020-11-12 LAB — HCV AB W REFLEX TO QUANT PCR: HCV Ab: <0.1 s/co ratio (ref 0.0–0.9)

## 2020-11-12 LAB — HCV INTERPRETATION

## 2020-11-14 LAB — HIV ANTIBODY (ROUTINE TESTING W REFLEX): HIV 1&2 Ab, 4th Generation: NONREACTIVE

## 2020-11-17 ENCOUNTER — Encounter: Payer: Self-pay | Admitting: Family Medicine

## 2020-11-17 DIAGNOSIS — F39 Unspecified mood [affective] disorder: Secondary | ICD-10-CM

## 2020-11-28 DIAGNOSIS — F4320 Adjustment disorder, unspecified: Secondary | ICD-10-CM | POA: Diagnosis not present

## 2020-12-14 DIAGNOSIS — F4320 Adjustment disorder, unspecified: Secondary | ICD-10-CM | POA: Diagnosis not present

## 2020-12-19 DIAGNOSIS — D225 Melanocytic nevi of trunk: Secondary | ICD-10-CM | POA: Diagnosis not present

## 2020-12-19 DIAGNOSIS — D485 Neoplasm of uncertain behavior of skin: Secondary | ICD-10-CM | POA: Diagnosis not present

## 2020-12-19 DIAGNOSIS — D2371 Other benign neoplasm of skin of right lower limb, including hip: Secondary | ICD-10-CM | POA: Diagnosis not present

## 2020-12-26 ENCOUNTER — Ambulatory Visit: Payer: BC Managed Care – PPO | Admitting: Family Medicine

## 2021-01-11 DIAGNOSIS — F4320 Adjustment disorder, unspecified: Secondary | ICD-10-CM | POA: Diagnosis not present

## 2021-01-17 ENCOUNTER — Encounter: Payer: Self-pay | Admitting: Family Medicine

## 2021-01-25 ENCOUNTER — Ambulatory Visit: Payer: BC Managed Care – PPO | Admitting: Family Medicine

## 2021-02-06 DIAGNOSIS — H43392 Other vitreous opacities, left eye: Secondary | ICD-10-CM | POA: Diagnosis not present

## 2021-02-08 DIAGNOSIS — F4320 Adjustment disorder, unspecified: Secondary | ICD-10-CM | POA: Diagnosis not present

## 2021-02-23 DIAGNOSIS — R7303 Prediabetes: Secondary | ICD-10-CM | POA: Diagnosis not present

## 2021-03-01 DIAGNOSIS — F4320 Adjustment disorder, unspecified: Secondary | ICD-10-CM | POA: Diagnosis not present

## 2021-03-12 ENCOUNTER — Other Ambulatory Visit: Payer: Self-pay | Admitting: Family Medicine

## 2021-03-12 DIAGNOSIS — F39 Unspecified mood [affective] disorder: Secondary | ICD-10-CM

## 2021-03-13 ENCOUNTER — Encounter: Payer: Self-pay | Admitting: Family Medicine

## 2021-03-13 MED ORDER — BUPROPION HCL ER (XL) 300 MG PO TB24: 300.0000 mg | ORAL_TABLET | Freq: Every day | ORAL | 0 refills | Status: DC

## 2021-03-13 NOTE — Addendum Note (Signed)
Addended by: Loyola Mast on: 03/13/2021 06:33 PM   Modules accepted: Orders

## 2021-03-17 ENCOUNTER — Other Ambulatory Visit: Payer: Self-pay

## 2021-03-20 ENCOUNTER — Ambulatory Visit (INDEPENDENT_AMBULATORY_CARE_PROVIDER_SITE_OTHER): Payer: BC Managed Care – PPO | Admitting: Family Medicine

## 2021-03-20 ENCOUNTER — Other Ambulatory Visit: Payer: Self-pay

## 2021-03-20 VITALS — BP 120/72 | HR 76 | Temp 97.8°F | Ht 65.5 in | Wt 142.2 lb

## 2021-03-20 DIAGNOSIS — F39 Unspecified mood [affective] disorder: Secondary | ICD-10-CM

## 2021-03-20 DIAGNOSIS — Z23 Encounter for immunization: Secondary | ICD-10-CM | POA: Diagnosis not present

## 2021-03-20 MED ORDER — BUPROPION HCL ER (XL) 300 MG PO TB24: 300.0000 mg | ORAL_TABLET | Freq: Every day | ORAL | 3 refills | Status: DC

## 2021-03-20 NOTE — Progress Notes (Signed)
[facility] [address] [place] [address] Dept: [phone] Dept Fax: [phone]  Chronic Care Office Visit  Subjective:    Patient ID: Michele Sanchez, female    DOB: 1970/12/28, 51 y.o..   MRN: [id number]  Chief Complaint  Patient presents with   Follow-up    F/u  meds. No concerns.  Wants shingles vaccine and ? Last Tdap.     History of Present Illness:  Patient is in today for reassessment of chronic medical issues.  Ms. Molstad has a history of a mood disorder, which may actually reflect dysthymia. At her last visit, we started her on Effexor. However, she then recalled this had contributed to weight gain, so we ended up settling on Wellbutrin (bupropion). She is on 300 mg daily. She feels she is doing much better on medication and wants to continue this. Work and home life are going well. She tried a keto diet for a while, but is now mainly avoiding simple sugars and non-starchy vegetables. She finds that this makes her feel better in general.  Past Medical History: Patient Active Problem List   Diagnosis Date Noted   Mood disorder ([place]) 11/11/2020   Attention deficit disorder (ADD) without hyperactivity 09/29/2020   Prediabetes 09/29/2020   Atrophic vaginitis 09/29/2020   Past Surgical History:  Procedure Laterality Date   CESAREAN SECTION     x 2   LEEP     Family History  Problem Relation Age of Onset   Heart disease Mother    Heart failure Mother    Heart disease Father    Heart disease Sister    Diabetes Sister    Heart disease Maternal Grandmother    Diabetes Maternal Grandmother    Colon polyps Neg Hx    Colon cancer Neg Hx    Esophageal cancer Neg Hx    Stomach cancer Neg Hx    Rectal cancer Neg Hx    Outpatient Medications Prior to Visit  Medication Sig Dispense Refill   conjugated estrogens (PREMARIN) vaginal cream Place 1 Applicatorful vaginally 2 (two) times a week.     buPROPion  (WELLBUTRIN XL) 300 MG 24 hr tablet Take 1 tablet (300 mg total) by mouth daily. 7 tablet 0   amphetamine-dextroamphetamine (ADDERALL) 10 MG tablet Take 0.5 tablets (5 mg total) by mouth daily at 2 PM. (Patient not taking: Reported on 11/11/2020) 60 tablet 0   No facility-administered medications prior to visit.   Allergies  Allergen Reactions   Cashew Nut Oil    Objective:   Today's Vitals   03/20/21 1603  BP: 120/72  Pulse: 76  Temp: 97.8 F (36.6 C)  TempSrc: Temporal  SpO2: 98%  Weight: 142 lb 3.2 oz (64.5 kg)  Height: 5' 5.5" (1.664 m)   Body mass index is 23.3 kg/m.   General: Well developed, well nourished. No acute distress. Psych: Alert and oriented x3. Normal mood and affect.  Health Maintenance Due  Topic Date Due   TETANUS/TDAP  Never done   COVID-19 Vaccine (3 - Booster for Pfizer series) 07/02/2019   Zoster Vaccines- Shingrix (1 of 2) Never done   Assessment & Plan:   1. Mood disorder ([place]) Likely dysthymia. Improved on bupropion. I will plan to continue this and follow-up with her in 3 months. - buPROPion (WELLBUTRIN XL) 300 MG 24 hr tablet; Take 1 tablet (300 mg total) by mouth daily.  Dispense: 90 tablet; Refill: 3  2. Need for Tdap  vaccination  - Tdap vaccine greater than or equal to 7yo IM  3. Need for shingles vaccine  - Varicella-zoster vaccine IM (Shingrix)  Haydee Salter, MD

## 2021-03-21 IMAGING — DX DG LUMBAR SPINE 2-3V
3 series · 3 of 3 positions shown · non-contrast
Comparison: None.

CLINICAL DATA: Left low back/posterior hip pain for 2 months.

EXAM:
LUMBAR SPINE - 2-3 VIEW

[l-spine ap]
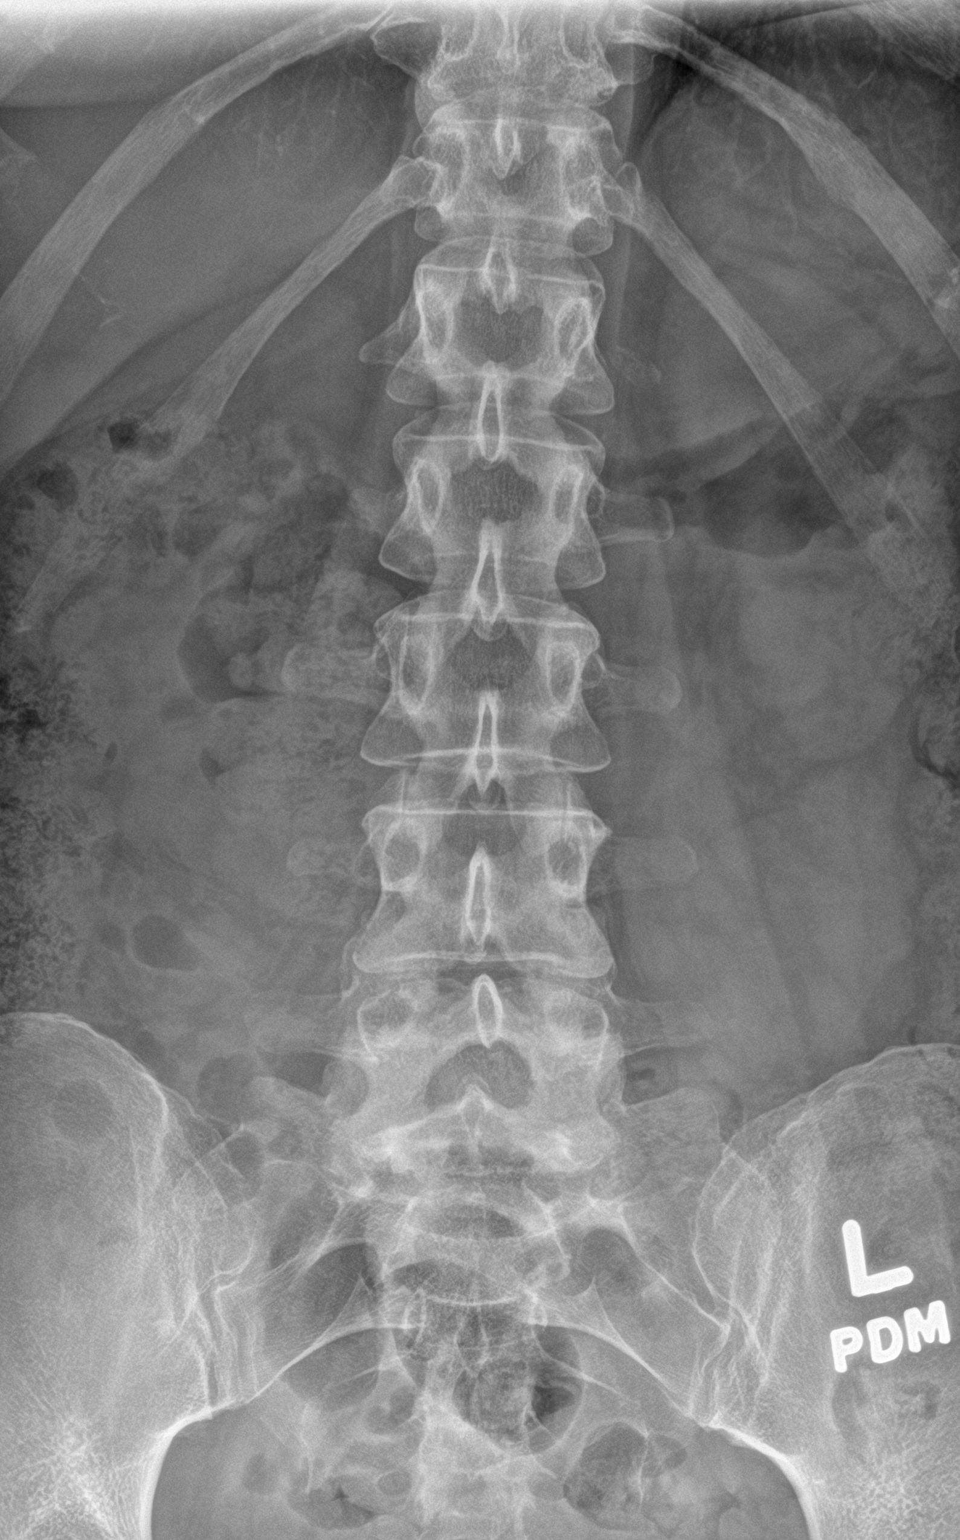

[l-spine lat]
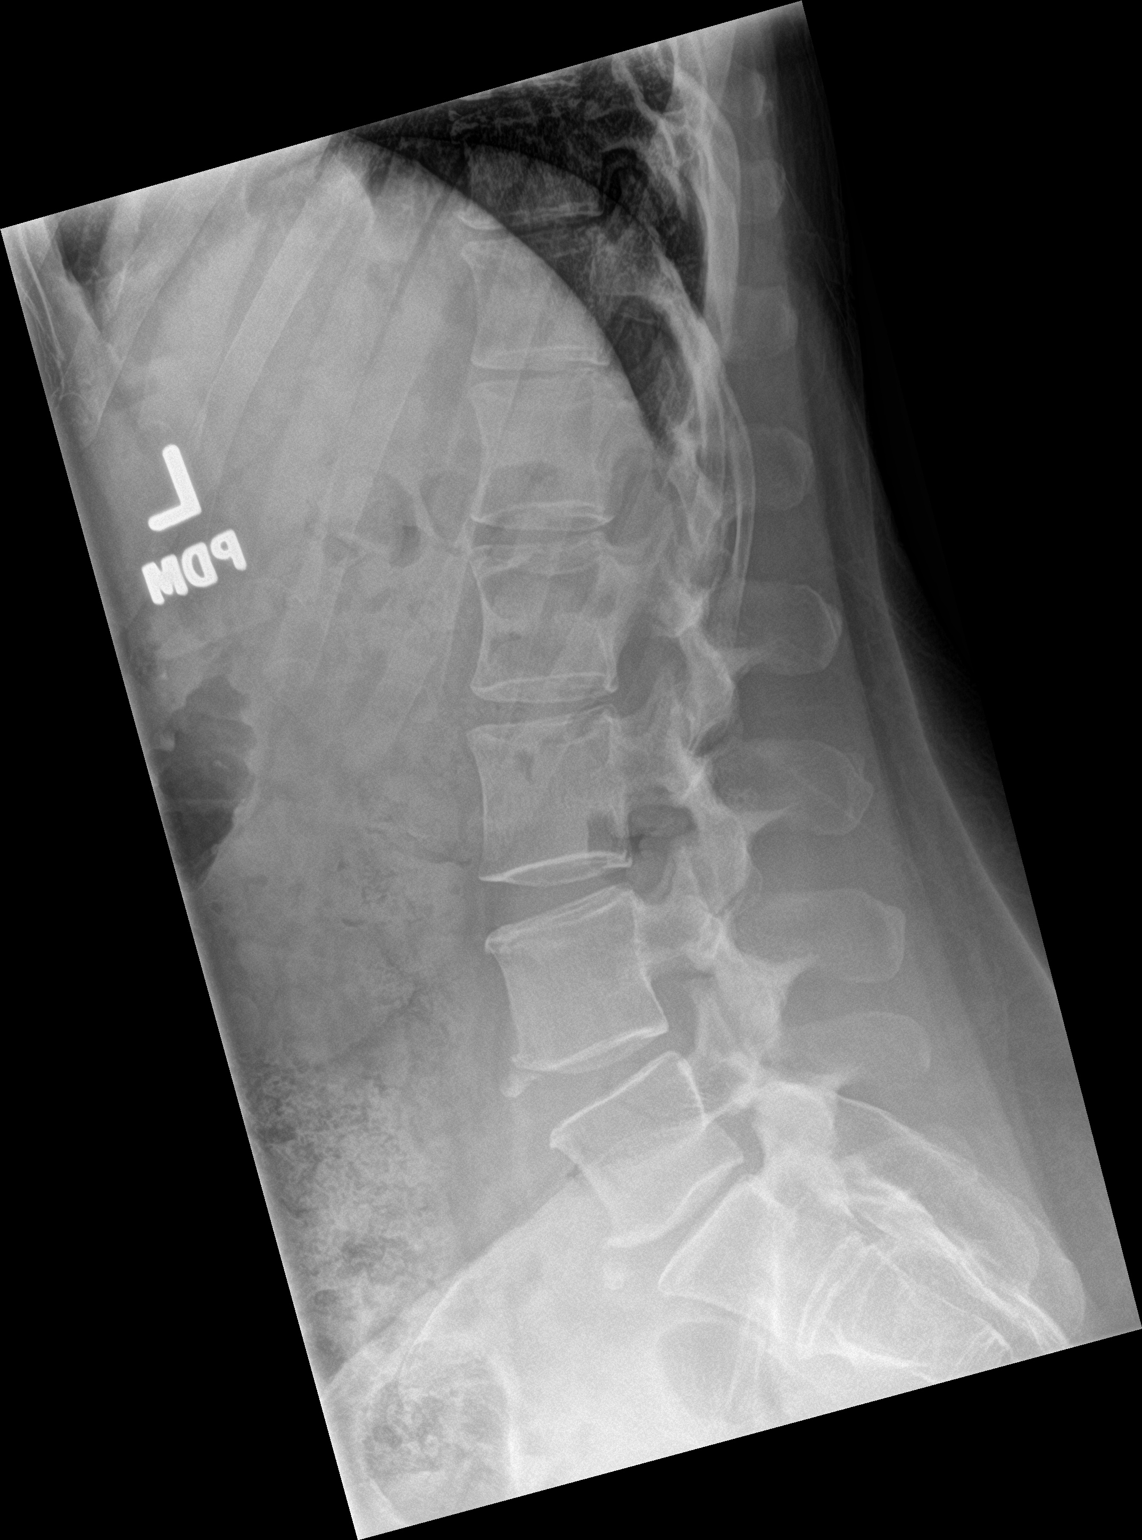

[l-spine spot]
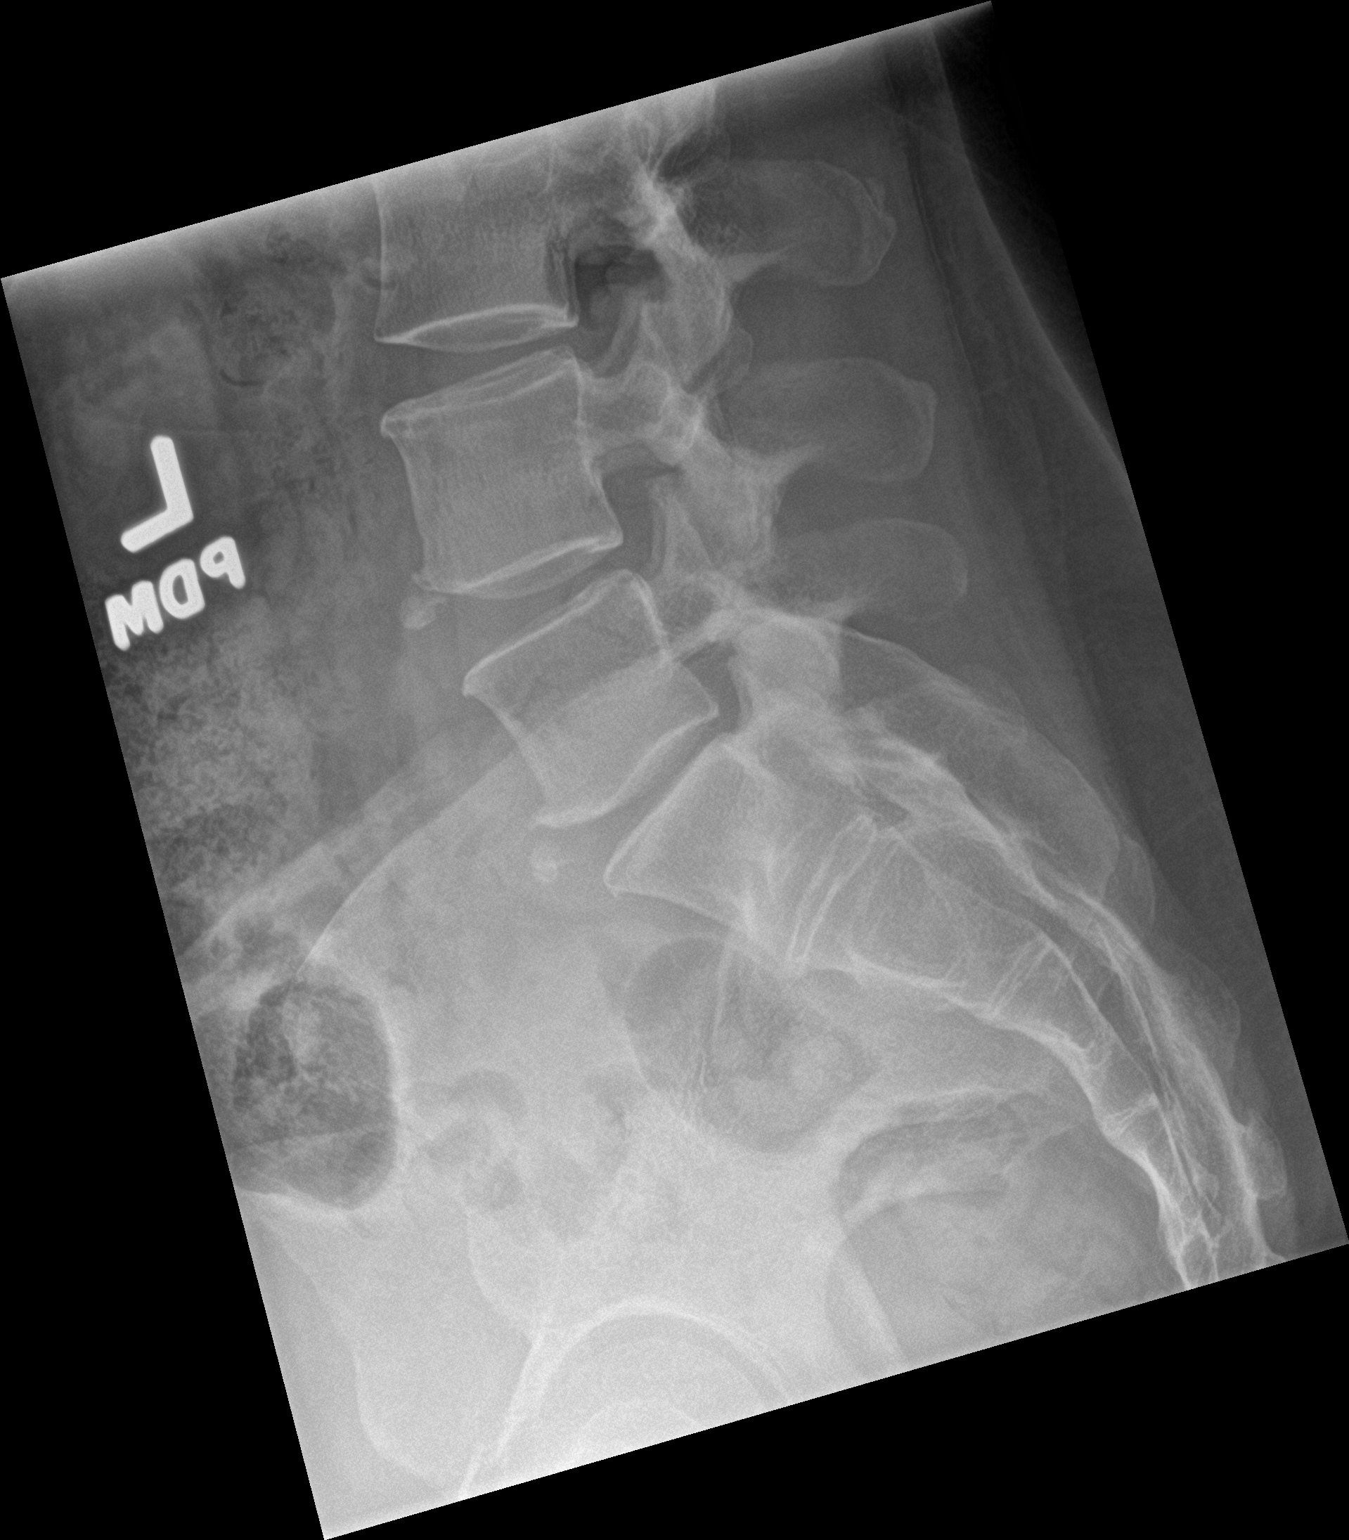

[3 of 3 positions shown; findings below may reference images not displayed]

FINDINGS: There are 5 non rib-bearing lumbar type vertebrae. A rudimentary
disc is noted at S1-2. Vertebral alignment is normal. Mild anterior
endplate spurring and annular calcification are noted at L4-5 and
L5-S1, and there is mild disc space narrowing at L5-S1. Disc space
heights are preserved elsewhere. No fracture is identified. A
moderate amount of stool is partially visualized throughout the
colon.
IMPRESSION: Mild lower lumbar disc degeneration.

## 2021-03-26 DIAGNOSIS — R7303 Prediabetes: Secondary | ICD-10-CM | POA: Diagnosis not present

## 2021-04-05 DIAGNOSIS — F4320 Adjustment disorder, unspecified: Secondary | ICD-10-CM | POA: Diagnosis not present

## 2021-04-23 DIAGNOSIS — R7303 Prediabetes: Secondary | ICD-10-CM | POA: Diagnosis not present

## 2021-05-04 DIAGNOSIS — B3731 Acute candidiasis of vulva and vagina: Secondary | ICD-10-CM | POA: Diagnosis not present

## 2021-05-04 DIAGNOSIS — N760 Acute vaginitis: Secondary | ICD-10-CM | POA: Diagnosis not present

## 2021-05-04 DIAGNOSIS — N898 Other specified noninflammatory disorders of vagina: Secondary | ICD-10-CM | POA: Diagnosis not present

## 2021-05-17 DIAGNOSIS — F4320 Adjustment disorder, unspecified: Secondary | ICD-10-CM | POA: Diagnosis not present

## 2021-05-24 DIAGNOSIS — R7303 Prediabetes: Secondary | ICD-10-CM | POA: Diagnosis not present

## 2021-06-07 DIAGNOSIS — F4320 Adjustment disorder, unspecified: Secondary | ICD-10-CM | POA: Diagnosis not present

## 2021-06-23 DIAGNOSIS — R7303 Prediabetes: Secondary | ICD-10-CM | POA: Diagnosis not present

## 2021-07-24 DIAGNOSIS — R7303 Prediabetes: Secondary | ICD-10-CM | POA: Diagnosis not present

## 2021-07-26 DIAGNOSIS — F4320 Adjustment disorder, unspecified: Secondary | ICD-10-CM | POA: Diagnosis not present

## 2021-07-31 DIAGNOSIS — F4320 Adjustment disorder, unspecified: Secondary | ICD-10-CM | POA: Diagnosis not present

## 2021-08-23 DIAGNOSIS — R7303 Prediabetes: Secondary | ICD-10-CM | POA: Diagnosis not present

## 2021-08-31 DIAGNOSIS — Z6824 Body mass index (BMI) 24.0-24.9, adult: Secondary | ICD-10-CM | POA: Diagnosis not present

## 2021-08-31 DIAGNOSIS — Z1231 Encounter for screening mammogram for malignant neoplasm of breast: Secondary | ICD-10-CM | POA: Diagnosis not present

## 2021-08-31 DIAGNOSIS — Z0142 Encounter for cervical smear to confirm findings of recent normal smear following initial abnormal smear: Secondary | ICD-10-CM | POA: Diagnosis not present

## 2021-08-31 DIAGNOSIS — Z01419 Encounter for gynecological examination (general) (routine) without abnormal findings: Secondary | ICD-10-CM | POA: Diagnosis not present

## 2021-08-31 DIAGNOSIS — L659 Nonscarring hair loss, unspecified: Secondary | ICD-10-CM | POA: Diagnosis not present

## 2021-08-31 LAB — HM MAMMOGRAPHY

## 2021-09-04 ENCOUNTER — Encounter: Payer: Self-pay | Admitting: Family Medicine

## 2021-09-04 DIAGNOSIS — F4320 Adjustment disorder, unspecified: Secondary | ICD-10-CM | POA: Diagnosis not present

## 2021-09-18 ENCOUNTER — Encounter: Payer: Self-pay | Admitting: Family Medicine

## 2021-09-18 ENCOUNTER — Ambulatory Visit (INDEPENDENT_AMBULATORY_CARE_PROVIDER_SITE_OTHER): Payer: BC Managed Care – PPO | Admitting: Family Medicine

## 2021-09-18 VITALS — BP 116/70 | HR 95 | Temp 97.2°F | Ht 65.25 in | Wt 151.4 lb

## 2021-09-18 DIAGNOSIS — R011 Cardiac murmur, unspecified: Secondary | ICD-10-CM | POA: Insufficient documentation

## 2021-09-18 DIAGNOSIS — R7303 Prediabetes: Secondary | ICD-10-CM

## 2021-09-18 DIAGNOSIS — N950 Postmenopausal bleeding: Secondary | ICD-10-CM | POA: Insufficient documentation

## 2021-09-18 DIAGNOSIS — F39 Unspecified mood [affective] disorder: Secondary | ICD-10-CM | POA: Diagnosis not present

## 2021-09-18 DIAGNOSIS — N3020 Other chronic cystitis without hematuria: Secondary | ICD-10-CM | POA: Insufficient documentation

## 2021-09-18 NOTE — Progress Notes (Signed)
[facility] [address] [place] [address] Dept: [phone] Dept Fax: [phone]  Chronic Care Office Visit  Subjective:    Patient ID: Michele Sanchez, female    DOB: 04-26-1970, 51 y.o..   MRN: [id number]  Chief Complaint  Patient presents with   Follow-up    6 month f/u. No concerns.  No shingles shot today.      History of Present Illness:  Patient is in today for reassessment of chronic medical issues.  Michele Sanchez has a history of a mood disorder, which may actually reflect dysthymia. We tried her on Effexor, but she stopped due to weight gain. She was then changed to Wellbutrin XL (bupropion). She felt her mood was improved, but also was having frequent hives. She had not had a similar issue when using bupropion in the past. She tapered off and stopped the bupropion. She feels better at this point and feels her mood has improved. Interestingly, she has noted that the mood does worse in the winter time and improves in the summer.  Michele Sanchez has a history of prediabetes. Her work had signed her up for [employer]. She notes they promoted routine exercise and a keto diet. She found the diet to not be palatable and missed out on some of the vegetables she enjoys consuming. She has stopped this. She is still getting some exercise with her S.O. She had been working out with a Psychologist, educational, but notes she periodically lets this go. She attributes recent weight gain to stopping use of bupropion.  Past Medical History: Patient Active Problem List   Diagnosis Date Noted   Postmenopausal bleeding 09/18/2021   Heart murmur 09/18/2021   Chronic cystitis 09/18/2021   Mood disorder (HCC) 11/11/2020   Attention deficit disorder (ADD) without hyperactivity 09/29/2020   Prediabetes 09/29/2020   Atrophic vaginitis 09/29/2020   Past Surgical History:  Procedure Laterality Date   CESAREAN SECTION     x 2   LEEP     Family History   Problem Relation Age of Onset   Heart disease Mother    Heart failure Mother    Heart disease Father    Heart disease Sister    Diabetes Sister    Heart disease Maternal Grandmother    Diabetes Maternal Grandmother    Colon polyps Neg Hx    Colon cancer Neg Hx    Esophageal cancer Neg Hx    Stomach cancer Neg Hx    Rectal cancer Neg Hx    Outpatient Medications Prior to Visit  Medication Sig Dispense Refill   estradiol (ESTRACE VAGINAL) 0.1 MG/GM vaginal cream Insert 1 g twice a week by vaginal route as directed for 30 days.     conjugated estrogens (PREMARIN) vaginal cream Place 1 Applicatorful vaginally 2 (two) times a week.     buPROPion (WELLBUTRIN XL) 300 MG 24 hr tablet Take 1 tablet (300 mg total) by mouth daily. 90 tablet 3   No facility-administered medications prior to visit.   Allergies  Allergen Reactions   Cashew Nut Oil      Objective:   Today's Vitals   09/18/21 1502  BP: 116/70  Pulse: 95  Temp: (!) 97.2 F (36.2 C)  TempSrc: Temporal  SpO2: 96%  Weight: 151 lb 6.4 oz (68.7 kg)  Height: 5' 5.25" (1.657 m)   Body mass index is 25 kg/m.   General: Well developed, well nourished. No acute distress. Psych: Alert and oriented. Normal  mood and affect.  Health Maintenance Due  Topic Date Due   COVID-19 Vaccine (3 - Pfizer risk series) 06/04/2019   Zoster Vaccines- Shingrix (2 of 2) 05/15/2021   INFLUENZA VACCINE  08/29/2021     Assessment & Plan:   1. Mood disorder (HCC) Improved currently off of meds. We did discuss that she may have an aspect of Seasonal Affective Disorder. We discussed the potential use of UV light therapy this Fall/Winter to see if this could improve her mood.  2. Prediabetes I will reassess her A1c today. We discussed aspects of exercise and the benefits it has for preventing progression of prediabetes.  - Hemoglobin A1c - Glucose, random   Return in about 1 year (around 09/19/2022) for Annual preventative care.    Loyola Mast, MD

## 2021-09-19 LAB — GLUCOSE, RANDOM: Glucose, Bld: 89 mg/dL (ref 70–99)

## 2021-09-19 LAB — HEMOGLOBIN A1C: Hgb A1c MFr Bld: 5.8 % (ref 4.6–6.5)

## 2021-09-23 DIAGNOSIS — R7303 Prediabetes: Secondary | ICD-10-CM | POA: Diagnosis not present

## 2021-10-04 DIAGNOSIS — F4320 Adjustment disorder, unspecified: Secondary | ICD-10-CM | POA: Diagnosis not present

## 2021-10-24 DIAGNOSIS — R7303 Prediabetes: Secondary | ICD-10-CM | POA: Diagnosis not present

## 2021-11-08 DIAGNOSIS — F4320 Adjustment disorder, unspecified: Secondary | ICD-10-CM | POA: Diagnosis not present

## 2021-11-23 DIAGNOSIS — R7303 Prediabetes: Secondary | ICD-10-CM | POA: Diagnosis not present

## 2021-11-29 DIAGNOSIS — F4320 Adjustment disorder, unspecified: Secondary | ICD-10-CM | POA: Diagnosis not present

## 2021-12-16 ENCOUNTER — Telehealth: Payer: BC Managed Care – PPO | Admitting: Nurse Practitioner

## 2021-12-16 DIAGNOSIS — U071 COVID-19: Secondary | ICD-10-CM

## 2021-12-16 MED ORDER — LIDOCAINE VISCOUS HCL 2 % MT SOLN: 15.0000 mL | OROMUCOSAL | 0 refills | Status: DC | PRN

## 2021-12-16 NOTE — Progress Notes (Signed)
Virtual Visit Consent   Michele Sanchez, you are scheduled for a virtual visit with Michele Daphine Deutscher, FNP, a [facility] provider, today.     Just as with appointments in the office, your consent must be obtained to participate.  Your consent will be active for this visit and any virtual visit you may have with one of our providers in the next 365 days.     If you have a MyChart account, a copy of this consent can be sent to you electronically.  All virtual visits are billed to your insurance company just like a traditional visit in the office.    As this is a virtual visit, video technology does not allow for your provider to perform a traditional examination.  This may limit your provider's ability to fully assess your condition.  If your provider identifies any concerns that need to be evaluated in person or the need to arrange testing (such as labs, EKG, etc.), we will make arrangements to do so.     Although advances in technology are sophisticated, we cannot ensure that it will always work on either your end or our end.  If the connection with a video visit is poor, the visit may have to be switched to a telephone visit.  With either a video or telephone visit, we are not always able to ensure that we have a secure connection.     I need to obtain your verbal consent now.   Are you willing to proceed with your visit today? YES   Michele Sanchez has provided verbal consent on 12/16/2021 for a virtual visit (video or telephone).   Michele Daphine Deutscher, FNP   Date: 12/16/2021 9:13 AM   Virtual Visit via Video Note   I, Michele Sanchez, connected with Michele Sanchez ([id number], 03-May-1970) on 12/16/21 at  9:30 AM EST by a video-enabled telemedicine application and verified that I am speaking with the correct person using two identifiers.  Location: Patient: Virtual Visit Location Patient: Home Provider: Virtual Visit Location Provider: Mobile   I discussed the  limitations of evaluation and management by telemedicine and the availability of in person appointments. The patient expressed understanding and agreed to proceed.    History of Present Illness: Michele Sanchez is a 51 y.o. who identifies as a female who was assigned female at birth, and is being seen today for covid positive.  HPI: Tested positive for covid this morning.  URI  This is a new problem. The current episode started in the past 7 days. The problem has been waxing and waning. There has been no fever. Associated symptoms include congestion, coughing (very little), headaches, a plugged ear sensation, rhinorrhea and a sore throat. She has tried acetaminophen (advil cold and sinus) for the symptoms. The treatment provided mild relief.    Review of Systems  HENT:  Positive for congestion, rhinorrhea and sore throat.   Respiratory:  Positive for cough (very little).   Neurological:  Positive for headaches.    Problems:  Patient Active Problem List   Diagnosis Date Noted   Postmenopausal bleeding 09/18/2021   Heart murmur 09/18/2021   Chronic cystitis 09/18/2021   Mood disorder (HCC) 11/11/2020   Attention deficit disorder (ADD) without hyperactivity 09/29/2020   Prediabetes 09/29/2020   Atrophic vaginitis 09/29/2020    Allergies:  Allergies  Allergen Reactions   Cashew Nut Oil    Medications:  Current Outpatient Medications:    estradiol (ESTRACE VAGINAL) 0.1 MG/GM vaginal cream,  Insert 1 g twice a week by vaginal route as directed for 30 days., Disp: , Rfl:   Observations/Objective: Patient is well-developed, well-nourished in no acute distress.  Resting comfortably  at home.  Head is normocephalic, atraumatic.  No labored breathing.  Speech is clear and coherent with logical content.  Patient is alert and oriented at baseline.  Scratchy voice  Assessment and Plan:  Michele Sanchez in today with chief complaint of Covid Positive   1. Positive  self-administered antigen test for COVID-19 1. Take meds as prescribed 2. Use a cool mist humidifier especially during the winter months and when heat has been humid. 3. Use saline nose sprays frequently 4. Saline irrigations of the nose can be very helpful if done frequently.  * 4X daily for 1 week*  * Use of a nettie pot can be helpful with this. Follow directions with this* 5. Drink plenty of fluids 6. Keep thermostat turn down low 7.For any cough or congestion- robitussin OTC 8. For fever or aces or pains- take tylenol or ibuprofen appropriate for age and weight.  * for fevers greater than 101 orally you may alternate ibuprofen and tylenol every  3 hours.    - lidocaine (XYLOCAINE) 2 % solution; Use as directed 15 mLs in the mouth or throat as needed for mouth pain.  Dispense: 200 mL; Refill: 0    Follow Up Instructions: I discussed the assessment and treatment plan with the patient. The patient was provided an opportunity to ask questions and all were answered. The patient agreed with the plan and demonstrated an understanding of the instructions.  A copy of instructions were sent to the patient via MyChart.  The patient was advised to call back or seek an in-person evaluation if the symptoms worsen or if the condition fails to improve as anticipated.  Time:  I spent 8 minutes with the patient via telehealth technology discussing the above problems/concerns.    Michele Daphine Deutscher, FNP

## 2021-12-16 NOTE — Patient Instructions (Signed)

## 2022-01-16 DIAGNOSIS — D225 Melanocytic nevi of trunk: Secondary | ICD-10-CM | POA: Diagnosis not present

## 2022-01-16 DIAGNOSIS — L821 Other seborrheic keratosis: Secondary | ICD-10-CM | POA: Diagnosis not present

## 2022-01-16 DIAGNOSIS — L578 Other skin changes due to chronic exposure to nonionizing radiation: Secondary | ICD-10-CM | POA: Diagnosis not present

## 2022-01-16 DIAGNOSIS — D2371 Other benign neoplasm of skin of right lower limb, including hip: Secondary | ICD-10-CM | POA: Diagnosis not present

## 2022-01-17 DIAGNOSIS — F4320 Adjustment disorder, unspecified: Secondary | ICD-10-CM | POA: Diagnosis not present

## 2022-02-17 ENCOUNTER — Encounter (HOSPITAL_COMMUNITY): Payer: Self-pay

## 2022-02-17 ENCOUNTER — Ambulatory Visit (HOSPITAL_COMMUNITY)
Admission: EM | Admit: 2022-02-17 | Discharge: 2022-02-17 | Disposition: A | Discharge status: Home / Self Care | Payer: No Typology Code available for payment source | Attending: Emergency Medicine | Admitting: Emergency Medicine

## 2022-02-17 DIAGNOSIS — N3010 Interstitial cystitis (chronic) without hematuria: Secondary | ICD-10-CM | POA: Diagnosis not present

## 2022-02-17 LAB — POCT URINALYSIS DIPSTICK, ED / UC
Bilirubin Urine: NEGATIVE
Glucose, UA: NEGATIVE mg/dL
Hgb urine dipstick: NEGATIVE
Ketones, ur: NEGATIVE mg/dL
Leukocytes,Ua: NEGATIVE
Nitrite: NEGATIVE
Protein, ur: NEGATIVE mg/dL
Specific Gravity, Urine: 1.025 (ref 1.005–1.030)
Urobilinogen, UA: 0.2 mg/dL (ref 0.0–1.0)
pH: 5.5 (ref 5.0–8.0)

## 2022-02-17 NOTE — Discharge Instructions (Addendum)
Your urine is negative for acute uti. Please drink plenty of water. No antibiotics are indicated at this time. Follow up your ob/gyn for further evaluation. Return as needed.

## 2022-02-17 NOTE — ED Provider Notes (Addendum)
[place]    CSN: [id number] Arrival date & time: 02/17/22  1737      History   Chief Complaint Chief Complaint  Patient presents with   Urinary Tract Infection    HPI Michele Sanchez is a 52 y.o. female.   52 year old female, (replace) Michele Sanchez, presents to urgent care chief complaint of urinary frequency, dysuria x 2 days.  Patient reports using estradiol cream, last menstrual period 2018. Pt denies fever,abdominal pain or additional complaints, requesting Bactrim.   The history is provided by the patient. No language interpreter was used.    History reviewed. No pertinent past medical history.  Patient Active Problem List   Diagnosis Date Noted   Postmenopausal bleeding 09/18/2021   Heart murmur 09/18/2021   Chronic cystitis 09/18/2021   Mood disorder ([place]) 11/11/2020   Attention deficit disorder (ADD) without hyperactivity 09/29/2020   Prediabetes 09/29/2020   Atrophic vaginitis 09/29/2020    Past Surgical History:  Procedure Laterality Date   CESAREAN SECTION     x 2   LEEP      OB History   No obstetric history on file.      Home Medications    Prior to Admission medications   Medication Sig Start Date End Date Taking? Authorizing Provider  estradiol (ESTRACE VAGINAL) 0.1 MG/GM vaginal cream Insert 1 g twice a week by vaginal route as directed for 30 days. 08/31/21   [provider]  lidocaine (XYLOCAINE) 2 % solution Use as directed 15 mLs in the mouth or throat as needed for mouth pain. 12/16/21   Chevis Pretty, FNP    Family History Family History  Problem Relation Age of Onset   Heart disease Mother    Heart failure Mother    Heart disease Father    Heart disease Sister    Diabetes Sister    Heart disease Maternal Grandmother    Diabetes Maternal Grandmother    Colon polyps Neg Hx    Colon cancer Neg Hx    Esophageal cancer Neg Hx    Stomach cancer Neg Hx    Rectal cancer Neg Hx     Social History Social  History   Tobacco Use   Smoking status: Former    Types: Cigarettes    Quit date: 1999    Years since quitting: 25.0   Smokeless tobacco: Never  Vaping Use   Vaping Use: Never used  Substance Use Topics   Alcohol use: Yes    Comment: socially   Drug use: Never     Allergies   Cashew nut oil   Review of Systems Review of Systems  Constitutional:  Negative for fever.  Genitourinary:  Positive for dysuria.  All other systems reviewed and are negative.    Physical Exam Triage Vital Signs ED Triage Vitals [02/17/22 1822]  Enc Vitals Group     BP (!) 104/58     Pulse Rate 95     Resp 18     Temp 98.2 F (36.8 C)     Temp Source Oral     SpO2 100 %     Weight      Height      Head Circumference      Peak Flow      Pain Score      Pain Loc      Pain Edu?      Excl. in [place]?    No data found.  Updated Vital Signs BP Marland Kitchen)  104/58 (BP Location: Left Arm)   Pulse 95   Temp 98.2 F (36.8 C) (Oral)   Resp 18   SpO2 100%   Visual Acuity Right Eye Distance:   Left Eye Distance:   Bilateral Distance:    Right Eye Near:   Left Eye Near:    Bilateral Near:     Physical Exam Vitals and nursing note reviewed.  Constitutional:      General: She is not in acute distress.    Appearance: She is well-developed and well-groomed.  HENT:     Head: Normocephalic and atraumatic.  Eyes:     Conjunctiva/sclera: Conjunctivae normal.  Neck:     Trachea: Trachea normal.  Cardiovascular:     Rate and Rhythm: Normal rate and regular rhythm.     Pulses: Normal pulses.     Heart sounds: Normal heart sounds. No murmur heard. Pulmonary:     Effort: Pulmonary effort is normal. No respiratory distress.  Abdominal:     Palpations: Abdomen is soft.     Tenderness: There is no abdominal tenderness.  Musculoskeletal:        General: No swelling.     Cervical back: Normal range of motion and neck supple.  Skin:    General: Skin is warm and dry.     Capillary Refill: Capillary  refill takes less than 2 seconds.  Neurological:     General: No focal deficit present.     Mental Status: She is alert and oriented to person, place, and time.     GCS: GCS eye subscore is 4. GCS verbal subscore is 5. GCS motor subscore is 6.  Psychiatric:        Attention and Perception: Attention normal.        Mood and Affect: Mood normal.        Speech: Speech normal.        Behavior: Behavior normal. Behavior is cooperative.      UC Treatments / Results  Labs (all labs ordered are listed, but only abnormal results are displayed) Labs Reviewed  POCT URINALYSIS DIPSTICK, ED / UC    EKG   Radiology No results found.  Procedures Procedures (including critical care time)  Medications Ordered in UC Medications - No data to display  Initial Impression / Assessment and Plan / UC Course  I have reviewed the triage vital signs and the nursing notes.  Pertinent labs & imaging results that were available during my care of the patient were reviewed by me and considered in my medical decision making (see chart for details).     Ddx: Dysuria, interstitial cystitis, hormonal changes, uti Final Clinical Impressions(s) / UC Diagnoses   Final diagnoses:  Interstitial cystitis     Discharge Instructions      Your urine is negative for acute uti. Please drink plenty of water. No antibiotics are indicated at this time. Follow up your ob/gyn for further evaluation. Return as needed.    ED Prescriptions   None    PDMP not reviewed this encounter.   Tori Milks, NP 55/73/22 0254    Tori Milks, NP 27/06/23 7628

## 2022-02-17 NOTE — ED Triage Notes (Signed)
2-day h/o mild dysuria.Pt has not taken anything for her symptoms. Pt is requesting Bactrim DS.

## 2022-11-13 ENCOUNTER — Encounter: Payer: Self-pay | Admitting: Family Medicine

## 2022-11-29 ENCOUNTER — Encounter: Payer: Self-pay | Admitting: Family Medicine

## 2022-11-29 ENCOUNTER — Ambulatory Visit: Payer: No Typology Code available for payment source | Admitting: Family Medicine

## 2022-11-29 VITALS — BP 118/74 | HR 63 | Temp 97.6°F | Ht 66.0 in | Wt 154.2 lb

## 2022-11-29 DIAGNOSIS — E559 Vitamin D deficiency, unspecified: Secondary | ICD-10-CM | POA: Insufficient documentation

## 2022-11-29 DIAGNOSIS — R7303 Prediabetes: Secondary | ICD-10-CM | POA: Diagnosis not present

## 2022-11-29 DIAGNOSIS — Z0000 Encounter for general adult medical examination without abnormal findings: Secondary | ICD-10-CM | POA: Diagnosis not present

## 2022-11-29 DIAGNOSIS — Z23 Encounter for immunization: Secondary | ICD-10-CM

## 2022-11-29 DIAGNOSIS — D2371 Other benign neoplasm of skin of right lower limb, including hip: Secondary | ICD-10-CM | POA: Insufficient documentation

## 2022-11-29 DIAGNOSIS — Z13220 Encounter for screening for lipoid disorders: Secondary | ICD-10-CM | POA: Diagnosis not present

## 2022-11-29 DIAGNOSIS — L821 Other seborrheic keratosis: Secondary | ICD-10-CM | POA: Insufficient documentation

## 2022-11-29 DIAGNOSIS — Z86018 Personal history of other benign neoplasm: Secondary | ICD-10-CM | POA: Insufficient documentation

## 2022-11-29 LAB — LIPID PANEL
Cholesterol: 171 mg/dL (ref 0–200)
HDL: 79.3 mg/dL (ref >39.00)
LDL Cholesterol: 78 mg/dL (ref 0–99)
NonHDL: 91.54
Total CHOL/HDL Ratio: 2
Triglycerides: 69 mg/dL (ref 0.0–149.0)
VLDL: 13.8 mg/dL (ref 0.0–40.0)

## 2022-11-29 LAB — HEMOGLOBIN A1C: Hgb A1c MFr Bld: 5.5 % (ref 4.6–6.5)

## 2022-11-29 LAB — BASIC METABOLIC PANEL
BUN: 9 mg/dL (ref 6–23)
CO2: 29 meq/L (ref 19–32)
Calcium: 9 mg/dL (ref 8.4–10.5)
Chloride: 101 meq/L (ref 96–112)
Creatinine, Ser: 0.73 mg/dL (ref 0.40–1.20)
GFR: 94.42 mL/min (ref >60.00)
Glucose, Bld: 89 mg/dL (ref 70–99)
Potassium: 4.1 meq/L (ref 3.5–5.1)
Sodium: 137 meq/L (ref 135–145)

## 2022-11-29 NOTE — Progress Notes (Signed)
[facility] [address] [place] [address] Dept: [phone] Dept Fax: [phone]  Annual Physical Visit  Subjective:    Patient ID: Michele Sanchez, female    DOB: 03/04/1970, 52 y.o..   MRN: [id number]  Chief Complaint  Patient presents with   Annual Exam   History of Present Illness:  Patient is in today for an annual physical/preventative visit. Notes she switched to a vegan diet in April. She hopes this is improving her health risk factors, such as lipid elevations and prediabetes.  Review of Systems  Constitutional:  Negative for chills, diaphoresis, fever, malaise/fatigue and weight loss.  HENT:  Positive for hearing loss. Negative for congestion, ear pain, sinus pain, sore throat and tinnitus.        Has had a long-standing concern about mild hearing loss in the right ear. Notes prior audiometry that did not show a significant loss.  Eyes:  Negative for blurred vision, pain, discharge and redness.  Respiratory:  Negative for cough, shortness of breath and wheezing.   Cardiovascular:  Negative for chest pain and palpitations.  Gastrointestinal:  Negative for abdominal pain, constipation, diarrhea, heartburn, nausea and vomiting.  Musculoskeletal:  Negative for back pain, joint pain and myalgias.  Skin:  Negative for itching and rash.  Psychiatric/Behavioral:  Negative for depression. The patient is not nervous/anxious.    Past Medical History: Patient Active Problem List   Diagnosis Date Noted   Dermatofibroma of right lower leg 11/29/2022   History of dysplastic nevus 11/29/2022   Other seborrheic keratosis 11/29/2022   Vitamin D deficiency 11/29/2022   Postmenopausal bleeding 09/18/2021   Heart murmur 09/18/2021   Chronic cystitis 09/18/2021   Mood disorder (HCC) 11/11/2020   Attention deficit disorder (ADD) without hyperactivity 09/29/2020   Prediabetes 09/29/2020   Atrophic vaginitis 09/29/2020    Past Surgical History:  Procedure Laterality Date   CESAREAN SECTION     x 2   LEEP     Family History  Problem Relation Age of Onset   Heart disease Mother    Heart failure Mother    Heart disease Father    Heart disease Sister    Diabetes Sister    Heart disease Maternal Grandmother    Diabetes Maternal Grandmother    Colon polyps Neg Hx    Colon cancer Neg Hx    Esophageal cancer Neg Hx    Stomach cancer Neg Hx    Rectal cancer Neg Hx    Outpatient Medications Prior to Visit  Medication Sig Dispense Refill   estradiol (ESTRACE VAGINAL) 0.1 MG/GM vaginal cream Insert 1 g twice a week by vaginal route as directed for 30 days.     Estradiol 0.5 MG/0.5GM GEL APPLY 1 PACKET TRANSDERMALLY ONCE DAILY AS DIRECTED BY PRESCRIBER     progesterone (PROMETRIUM) 100 MG capsule Take 1 capsule every day by oral route for 30 days.     lidocaine (XYLOCAINE) 2 % solution Use as directed 15 mLs in the mouth or throat as needed for mouth pain. 200 mL 0   No facility-administered medications prior to visit.   Allergies  Allergen Reactions   Cashew Nut Oil    Objective:   Today's Vitals   11/29/22 0814  BP: 118/74  Pulse: 63  Temp: 97.6 F (36.4 C)  TempSrc: Temporal  SpO2: 100%  Weight: 154 lb 3.2 oz (69.9 kg)  Height: 5\' 6"  (1.676 m)   Body mass index is 24.89 kg/m.  General: Well developed, well nourished. No acute distress. HEENT: Normocephalic, non-traumatic. PERRL, EOMI. Conjunctiva clear. External ears normal. EAC and TMs normal   bilaterally. Nose clear without congestion or rhinorrhea. Mucous membranes moist. Oropharynx clear. Good dentition. Neck: Supple. No lymphadenopathy. No thyromegaly. Lungs: Clear to auscultation bilaterally. No wheezing, rales or rhonchi. CV: RRR without murmurs or rubs. Pulses 2+ bilaterally. Abdomen: Soft, non-tender. Bowel sounds positive, normal pitch and frequency. No hepatosplenomegaly. No rebound   or guarding. Extremities: Full ROM.  No joint swelling or tenderness. No edema noted. Skin: Warm and dry. No rashes. Psych: Alert and oriented. Normal mood and affect.  Health Maintenance Due  Topic Date Due   Cervical Cancer Screening (HPV/Pap Cotest)  Never done   Zoster Vaccines- Shingrix (2 of 2) 05/15/2021   INFLUENZA VACCINE  08/30/2022     Assessment & Plan:   Problem List Items Addressed This Visit       Other   Prediabetes   Relevant Orders   Hemoglobin A1c   Basic metabolic panel   Other Visit Diagnoses     Annual physical exam    -  Primary   Overall excellent health. Encourage her to start some form of regular exercise. Discussed recommended screenings and immunizations.   Screening for lipid disorders       Relevant Orders   Lipid panel   Need for immunization against influenza       Relevant Orders   Flu vaccine trivalent PF, 6mos and older(Flulaval,Afluria,Fluarix,Fluzone) (Completed)       Return in about 1 year (around 11/29/2023) for Annual preventative care.   Loyola Mast, MD

## 2023-01-02 ENCOUNTER — Encounter: Payer: Self-pay | Admitting: Family Medicine

## 2023-01-02 ENCOUNTER — Ambulatory Visit: Payer: No Typology Code available for payment source | Admitting: Family Medicine

## 2023-01-02 VITALS — BP 116/68 | HR 74 | Temp 97.9°F | Ht 66.0 in | Wt 152.8 lb

## 2023-01-02 DIAGNOSIS — F988 Other specified behavioral and emotional disorders with onset usually occurring in childhood and adolescence: Secondary | ICD-10-CM

## 2023-01-02 MED ORDER — ADZENYS XR-ODT 6.3 MG PO TBED: 6.3000 mg | EXTENDED_RELEASE_TABLET | Freq: Every day | ORAL | 0 refills | Status: DC

## 2023-01-02 NOTE — Assessment & Plan Note (Signed)
I am willing to give Michele Sanchez a trial on [employer]	[place]. We will use the 6.3 mg daily dose. I will touch base with her in 1 month to see how this trial goes.

## 2023-01-02 NOTE — Progress Notes (Signed)
[facility] [address] [place] [address] Dept: [phone] Dept Fax: [phone]  Office Visit  Subjective:    Patient ID: Michele Sanchez, female    DOB: 09-22-1970, 52 y.o..   MRN: [id number]  Chief Complaint  Patient presents with   ADHD    Wants to discuss trying some new medication for ADHD.    History of Present Illness:  Patient is in today for discussion of treatment for ADD. Michele Sanchez was evaluated by Dr. Barron Alvine in June 2022 and diagnosed as having ADHD. She notes that she has had long-standing issues with difficulty with lack of confidence/self-doubt, shyness as a teen, disorganization, and intrusive thoughts, esp. distractions with memories of past experiences (nostalgia). She also finds she has low energy levels at times. She was initially started on Adderall but had issues with "grumpiness" and anxiousness. We then tried bupropion, which helped, but she developed some hair loss and hives. She has found that her estrogen replacement has helped some. Both of her daughters have ADD and are currently managed on Adzenys XR-ODT. She tried a dose of this and felt it helped, but also did not seem to have the same side effects she felt with Adderall.  Past Medical History: Patient Active Problem List   Diagnosis Date Noted   Dermatofibroma of right lower leg 11/29/2022   History of dysplastic nevus 11/29/2022   Other seborrheic keratosis 11/29/2022   Vitamin D deficiency 11/29/2022   Postmenopausal bleeding 09/18/2021   Heart murmur 09/18/2021   Chronic cystitis 09/18/2021   Mood disorder (HCC) 11/11/2020   Attention deficit disorder (ADD) without hyperactivity 09/29/2020   Prediabetes 09/29/2020   Atrophic vaginitis 09/29/2020   Past Surgical History:  Procedure Laterality Date   CESAREAN SECTION     x 2   LEEP     Family History  Problem Relation Age of Onset   Heart disease Mother    Heart failure  Mother    Heart disease Father    Heart disease Sister    Diabetes Sister    Heart disease Maternal Grandmother    Diabetes Maternal Grandmother    Colon polyps Neg Hx    Colon cancer Neg Hx    Esophageal cancer Neg Hx    Stomach cancer Neg Hx    Rectal cancer Neg Hx    Outpatient Medications Prior to Visit  Medication Sig Dispense Refill   estradiol (ESTRACE VAGINAL) 0.1 MG/GM vaginal cream Insert 1 g twice a week by vaginal route as directed for 30 days.     Estradiol 0.5 MG/0.5GM GEL APPLY 1 PACKET TRANSDERMALLY ONCE DAILY AS DIRECTED BY PRESCRIBER     progesterone (PROMETRIUM) 100 MG capsule Take 1 capsule every day by oral route for 30 days.     No facility-administered medications prior to visit.   Allergies  Allergen Reactions   Cashew Nut Oil      Objective:   Today's Vitals   01/02/23 0841  BP: 116/68  Pulse: 74  Temp: 97.9 F (36.6 C)  TempSrc: Temporal  SpO2: 100%  Weight: 152 lb 12.8 oz (69.3 kg)  Height: 5\' 6"  (1.676 m)   Body mass index is 24.66 kg/m.   General: Well developed, well nourished. No acute distress. Psych: Alert and oriented. Normal mood and affect.  Health Maintenance Due  Topic Date Due   Cervical Cancer Screening (HPV/Pap Cotest)  Never done   Zoster Vaccines- Shingrix (2 of 2) 05/15/2021  Assessment & Plan:   Problem List Items Addressed This Visit       Other   Attention deficit disorder (ADD) without hyperactivity - Primary    I am willing to give Michele Sanchez a trial on [employer]	[place]. We will use the 6.3 mg daily dose. I will touch base with her in 1 month to see how this trial goes.      Relevant Medications   Amphetamine ER (ADZENYS XR-ODT) 6.3 MG TBED    Return in about 4 weeks (around 01/30/2023) for Reassessment (virtual visit okay).   Loyola Mast, MD

## 2023-01-21 ENCOUNTER — Telehealth: Payer: No Typology Code available for payment source | Admitting: Family Medicine

## 2023-01-21 ENCOUNTER — Encounter: Payer: Self-pay | Admitting: Family Medicine

## 2023-01-21 DIAGNOSIS — F988 Other specified behavioral and emotional disorders with onset usually occurring in childhood and adolescence: Secondary | ICD-10-CM

## 2023-01-21 MED ORDER — ADZENYS XR-ODT 6.3 MG PO TBED: 6.3000 mg | EXTENDED_RELEASE_TABLET | Freq: Every day | ORAL | 0 refills | Status: DC

## 2023-01-21 NOTE — Progress Notes (Signed)
[facility] [address] [place] [address] Dept: [phone] Dept Fax: [phone]  Virtual Video Visit  I connected with Michele Sanchez on 01/21/23 at 10:40 AM EST by a video enabled telemedicine application and verified that I am speaking with the correct person using two identifiers.  Location patient: Home Location provider: Clinic Persons participating in the virtual visit: Patient, Provider  I discussed the limitations of evaluation and management by telemedicine and the availability of in person appointments. The patient expressed understanding and agreed to proceed.  Chief Complaint  Patient presents with   ADHD    4 week f/u.  No concerns.     SUBJECTIVE:  HPI: Michele Sanchez is a 52 y.o. female who presents for a review of her new therapy for ADHD. Ms. Ley was evaluated by Dr. Barron Alvine in June 2022 and diagnosed as having ADHD. She was initially started on Adderall but had issues with "grumpiness" and anxiousness. We then tried bupropion, which helped, but she developed some hair loss and hives. Both of her daughters have ADD and are currently managed on Adzenys XR-ODT. She tried a dose of this and felt it helped, but also did not seem to have the same side effects she felt with Adderall. I prescribed her a course of Adzenys 6.3 mg daily for a trial.  Ms. Brozyna notes that on the new medication, she does have improved focus, with less wandering of her attention and improved ability to complete tasks. She notes some of the mood changes she had previously seen with Adderall use, but notes this does seem less than before.  Patient Active Problem List   Diagnosis Date Noted   Dermatofibroma of right lower leg 11/29/2022   History of dysplastic nevus 11/29/2022   Other seborrheic keratosis 11/29/2022   Vitamin D deficiency 11/29/2022   Postmenopausal bleeding 09/18/2021   Heart murmur 09/18/2021   Chronic  cystitis 09/18/2021   Mood disorder (HCC) 11/11/2020   Attention deficit disorder (ADD) without hyperactivity 09/29/2020   Prediabetes 09/29/2020   Atrophic vaginitis 09/29/2020   Past Surgical History:  Procedure Laterality Date   CESAREAN SECTION     x 2   LEEP     Family History  Problem Relation Age of Onset   Heart disease Mother    Heart failure Mother    Heart disease Father    Heart disease Sister    Diabetes Sister    Heart disease Maternal Grandmother    Diabetes Maternal Grandmother    Colon polyps Neg Hx    Colon cancer Neg Hx    Esophageal cancer Neg Hx    Stomach cancer Neg Hx    Rectal cancer Neg Hx    Social History   Tobacco Use   Smoking status: Former    Current packs/day: 0.00    Types: Cigarettes    Quit date: 1999    Years since quitting: 25.9   Smokeless tobacco: Never  Vaping Use   Vaping status: Never Used  Substance Use Topics   Alcohol use: Yes    Comment: socially   Drug use: Never    Current Outpatient Medications:    estradiol (ESTRACE VAGINAL) 0.1 MG/GM vaginal cream, Insert 1 g twice a week by vaginal route as directed for 30 days., Disp: , Rfl:    Estradiol 0.5 MG/0.5GM GEL, APPLY 1 PACKET TRANSDERMALLY ONCE DAILY AS DIRECTED BY PRESCRIBER, Disp: , Rfl:    progesterone (PROMETRIUM) 100 MG  capsule, Take 1 capsule every day by oral route for 30 days., Disp: , Rfl:    Amphetamine ER (ADZENYS XR-ODT) 6.3 MG TBED, Take 1 tablet (6.3 mg total) by mouth daily., Disp: 30 tablet, Rfl: 0 Allergies  Allergen Reactions   Cashew Nut Oil    ROS: See pertinent positives and negatives per HPI.  OBSERVATIONS/OBJECTIVE:  VITALS per patient if applicable: Today's Vitals   01/21/23 1028  Weight: 152 lb (68.9 kg)  Height: 5\' 6"  (1.676 m)   Body mass index is 24.53 kg/m.    GENERAL: Alert and oriented. Appears well and in no acute distress. PSYCH/NEURO: Pleasant and cooperative. No obvious depression or anxiety. Speech and thought  processing grossly intact.  ASSESSMENT AND PLAN:  Problem List Items Addressed This Visit       Other   Attention deficit disorder (ADD) without hyperactivity   Relevant Medications   Amphetamine ER (ADZENYS XR-ODT) 6.3 MG TBED     I discussed the assessment and treatment plan with the patient. The patient was provided an opportunity to ask questions and all were answered. The patient agreed with the plan and demonstrated an understanding of the instructions.  Return in about 4 months (around 05/22/2023) for Reassessment.   Loyola Mast, MD

## 2023-01-21 NOTE — Assessment & Plan Note (Signed)
Some positive response with less side effects. I will continue Adzenys 6.3 mg daily dose.

## 2023-03-01 ENCOUNTER — Ambulatory Visit (INDEPENDENT_AMBULATORY_CARE_PROVIDER_SITE_OTHER): Payer: No Typology Code available for payment source | Admitting: Family Medicine

## 2023-03-01 VITALS — BP 118/72 | HR 72 | Temp 97.3°F | Ht 66.0 in | Wt 151.8 lb

## 2023-03-01 DIAGNOSIS — F39 Unspecified mood [affective] disorder: Secondary | ICD-10-CM | POA: Diagnosis not present

## 2023-03-01 DIAGNOSIS — M25561 Pain in right knee: Secondary | ICD-10-CM

## 2023-03-01 DIAGNOSIS — F988 Other specified behavioral and emotional disorders with onset usually occurring in childhood and adolescence: Secondary | ICD-10-CM | POA: Diagnosis not present

## 2023-03-01 NOTE — Assessment & Plan Note (Signed)
In light of the mood elevation with gradual let down, she may want to avoid stimulants for now.

## 2023-03-01 NOTE — Progress Notes (Signed)
[facility] [address] [place] [address] Dept: [phone] Dept Fax: [phone]  Office Visit  Subjective:    Patient ID: Geralynn Rile, female    DOB: 12-12-1970, 53 y.o..   MRN: [id number]  Chief Complaint  Patient presents with   Knee Pain    C/o having RT knee pain/buckling off/on x 1 year.      History of Present Illness:  Patient is in today noting some recent lateral right knee pain. She is not sure what might have set this off. Since she made the appointment, she had a massage. She ntoes this does seem to be doing better now.  Ms. Husak has a history of ADHD. She was initially started on Adderall but had issues with "grumpiness" and anxiousness. We then tried bupropion, which helped, but she developed some hair loss and hives. She is currently prescribed amphetamine (Adzenys) 6.3 mg daily. She has not been taking this every day. She feels that it elevates her mood and improves her focus. However, over the course of several days afterwards, she notes a gradual decline in her mood. Ms. Herst has had long-standing issues with difficulty with lack of confidence/self-doubt, shyness as a teen, disorganization, and intrusive thoughts, esp. distractions with memories of past experiences (nostalgia). She also has low energy levels at times and a chronically mildly depressed mood.  Past Medical History: Patient Active Problem List   Diagnosis Date Noted   Dermatofibroma of right lower leg 11/29/2022   History of dysplastic nevus 11/29/2022   Other seborrheic keratosis 11/29/2022   Vitamin D deficiency 11/29/2022   Postmenopausal bleeding 09/18/2021   Heart murmur 09/18/2021   Chronic cystitis 09/18/2021   Mood disorder (HCC) 11/11/2020   Attention deficit disorder (ADD) without hyperactivity 09/29/2020   Prediabetes 09/29/2020   Atrophic vaginitis 09/29/2020   Past Surgical History:  Procedure Laterality Date    CESAREAN SECTION     x 2   LEEP     Family History  Problem Relation Age of Onset   Heart disease Mother    Heart failure Mother    Heart disease Father    Heart disease Sister    Diabetes Sister    Heart disease Maternal Grandmother    Diabetes Maternal Grandmother    Colon polyps Neg Hx    Colon cancer Neg Hx    Esophageal cancer Neg Hx    Stomach cancer Neg Hx    Rectal cancer Neg Hx    Outpatient Medications Prior to Visit  Medication Sig Dispense Refill   Amphetamine ER (ADZENYS XR-ODT) 6.3 MG TBED Take 1 tablet (6.3 mg total) by mouth daily. 30 tablet 0   estradiol (ESTRACE VAGINAL) 0.1 MG/GM vaginal cream Insert 1 g twice a week by vaginal route as directed for 30 days.     Estradiol 0.5 MG/0.5GM GEL APPLY 1 PACKET TRANSDERMALLY ONCE DAILY AS DIRECTED BY PRESCRIBER     progesterone (PROMETRIUM) 100 MG capsule Take 1 capsule every day by oral route for 30 days.     No facility-administered medications prior to visit.   Allergies  Allergen Reactions   Cashew Nut Oil      Objective:   Today's Vitals   03/01/23 0802  BP: 118/72  Pulse: 72  Temp: (!) 97.3 F (36.3 C)  TempSrc: Temporal  SpO2: 99%  Weight: 151 lb 12.8 oz (68.9 kg)  Height: 5\' 6"  (1.676 m)   Body mass index is 24.5 kg/m.  General: Well developed, well nourished. No acute distress. Extremities: Pain indicated over the lateral right knee over the biceps femoris tendon. Psych: Alert and oriented. Normal mood and affect.  Health Maintenance Due  Topic Date Due   Cervical Cancer Screening (HPV/Pap Cotest)  Never done   Zoster Vaccines- Shingrix (2 of 2) 05/15/2021     Assessment & Plan:   Problem List Items Addressed This Visit       Other   Attention deficit disorder (ADD) without hyperactivity   In light of the mood elevation with gradual let down, she may want to avoid stimulants for now.      Mood disorder (HCC)   [name] is taking action in several ways to reduce triggers for  her mood issues, including talk therapy and avoidance of social media. She has not had good tolerance or efficacy with other medications in the past. We will cotninue to monitor how she is doing for now.      Other Visit Diagnoses       Acute pain of right knee    -  Primary   Appears to have been a tendinitis, that is now resolving. Discussed stretches, heat, and use of NSAIDs as needed.       Return if symptoms worsen or fail to improve.   Loyola Mast, MD

## 2023-03-01 NOTE — Assessment & Plan Note (Signed)
Michele Sanchez is taking action in several ways to reduce triggers for her mood issues, including talk therapy and avoidance of social media. She has not had good tolerance or efficacy with other medications in the past. We will cotninue to monitor how she is doing for now.

## 2023-09-23 ENCOUNTER — Encounter: Payer: Self-pay | Admitting: Obstetrics

## 2023-09-23 LAB — HM MAMMOGRAPHY

## 2023-12-02 ENCOUNTER — Ambulatory Visit (INDEPENDENT_AMBULATORY_CARE_PROVIDER_SITE_OTHER): Admitting: Family Medicine

## 2023-12-02 ENCOUNTER — Encounter: Payer: Self-pay | Admitting: Family Medicine

## 2023-12-02 VITALS — BP 124/72 | HR 74 | Temp 98.8°F | Ht 66.0 in | Wt 154.8 lb

## 2023-12-02 DIAGNOSIS — Z0000 Encounter for general adult medical examination without abnormal findings: Secondary | ICD-10-CM | POA: Insufficient documentation

## 2023-12-02 DIAGNOSIS — F39 Unspecified mood [affective] disorder: Secondary | ICD-10-CM

## 2023-12-02 NOTE — Assessment & Plan Note (Signed)
 I suspect that Ms. [name] may have dysthymia, rather than major depression. I recommend she start to engage in regular exercise as an approach to reduce her depressive symptoms.

## 2023-12-02 NOTE — Progress Notes (Signed)
 [facility] [address] [place] [address] Dept: [phone] Dept Fax: [phone]  Annual Physical Visit  Subjective:    Patient ID: Michele Sanchez, female    DOB: Mar 25, 1970, 53 y.o..   MRN: [id number]  Chief Complaint  Patient presents with   Annual Exam    CPE/labs.  No concerns.  Not fasting today.     History of Present Illness:  Patient is in today for an annual physical/preventative visit.  Review of Systems  Constitutional:  Negative for chills, diaphoresis, fever, malaise/fatigue and weight loss.  HENT:  Negative for congestion, ear pain, hearing loss, sinus pain, sore throat and tinnitus.   Eyes:  Negative for blurred vision, pain, discharge and redness.  Respiratory:  Negative for cough, shortness of breath and wheezing.   Cardiovascular:  Negative for chest pain and palpitations.  Gastrointestinal:  Negative for abdominal pain, constipation, diarrhea, heartburn, nausea and vomiting.  Musculoskeletal:  Negative for back pain, joint pain and myalgias.  Skin:  Negative for itching and rash.  Psychiatric/Behavioral:  Positive for depression. The patient is not nervous/anxious.        Michele Sanchez has had a chronic mood disorder that seems to fit more with dysthymia than actual depression. She prefers not to be on medication for this. Some of this did improve with increasing her estrogen dose that she has been on for menopausal symptoms. She admits that she does not exercise regularly.   Past Medical History: Patient Active Problem List   Diagnosis Date Noted   Dermatofibroma of right lower leg 11/29/2022   History of dysplastic nevus 11/29/2022   Other seborrheic keratosis 11/29/2022   Vitamin D  deficiency 11/29/2022   Postmenopausal bleeding 09/18/2021   Heart murmur 09/18/2021   Chronic cystitis 09/18/2021   Mood disorder 11/11/2020   Attention deficit disorder (ADD) without hyperactivity 09/29/2020    Prediabetes 09/29/2020   Atrophic vaginitis 09/29/2020   Past Surgical History:  Procedure Laterality Date   CESAREAN SECTION     x 2   LEEP     Family History  Problem Relation Age of Onset   Heart disease Mother    Heart failure Mother    Heart disease Father    Heart disease Sister    Diabetes Sister    Heart disease Maternal Grandmother    Diabetes Maternal Grandmother    Colon polyps Neg Hx    Colon cancer Neg Hx    Esophageal cancer Neg Hx    Stomach cancer Neg Hx    Rectal cancer Neg Hx    Outpatient Medications Prior to Visit  Medication Sig Dispense Refill   Amphetamine  ER (ADZENYS  XR-ODT) 6.3 MG TBED Take 1 tablet (6.3 mg total) by mouth daily. 30 tablet 0   estradiol (ESTRACE VAGINAL) 0.1 MG/GM vaginal cream Insert 1 g twice a week by vaginal route as directed for 30 days.     Estradiol 0.5 MG/0.5GM GEL APPLY 1 PACKET TRANSDERMALLY ONCE DAILY AS DIRECTED BY PRESCRIBER     progesterone (PROMETRIUM) 100 MG capsule Take 1 capsule every day by oral route for 30 days.     No facility-administered medications prior to visit.   Allergies  Allergen Reactions   Cashew Nut Oil    Objective:   Today's Vitals   12/02/23 1521  BP: 124/72  Pulse: 74  Temp: 98.8 F (37.1 C)  TempSrc: Temporal  SpO2: 98%  Weight: 154 lb 12.8 oz (70.2 kg)  Height: 5'  6 (1.676 m)   Body mass index is 24.99 kg/m.   General: Well developed, well nourished. No acute distress. HEENT: Normocephalic, non-traumatic. PERRL, EOMI. Conjunctiva clear. External ears normal. EAC and TMs normal   bilaterally. Nose clear without congestion or rhinorrhea. Mucous membranes moist. Oropharynx clear. Good dentition. Neck: Supple. No lymphadenopathy. No thyromegaly. Lungs: Clear to auscultation bilaterally. No wheezing, rales or rhonchi. CV: RRR without murmurs or rubs. Pulses 2+ bilaterally. Abdomen: Soft, non-tender. Bowel sounds positive, normal pitch and frequency. No hepatosplenomegaly. No  rebound or   guarding. Extremities: Full ROM. No joint swelling or tenderness. No edema noted. Skin: Warm and dry. No rashes. Psych: Alert and oriented. Normal mood and affect.  Health Maintenance Due  Topic Date Due   Hepatitis B Vaccines 19-59 Average Risk (1 of 3 - 19+ 3-dose series) Never done   Cervical Cancer Screening (HPV/Pap Cotest)  Never done   Pneumococcal Vaccine: 50+ Years (1 of 1 - PCV) Never done   Zoster Vaccines- Shingrix (2 of 2) 05/15/2021   Influenza Vaccine  08/30/2023   COVID-19 Vaccine (5 - 2025-26 season) 09/30/2023   Imaging Mammogram 09/23/2023 IMPRESSION:  No significant mammographic findings BI-RADS CATEGORY: 1 NEGATIVE    Assessment & Plan:   Problem List Items Addressed This Visit       Other   Annual physical exam - Primary   Overall health is good. Recommend she start engaging in regular exercise. Discussed recommended screenings and immunizations.       Mood disorder   I suspect that Michele Sanchez may have dysthymia, rather than major depression. I recommend she start to engage in regular exercise as an approach to reduce her depressive symptoms.       Return in about 1 year (around 12/01/2024) for Annual preventative care.   Garnette CHRISTELLA Simpler, MD  I,Emily Lagle,acting as a scribe for Garnette CHRISTELLA Simpler, MD.,have documented all relevant documentation on the behalf of Garnette CHRISTELLA Simpler, MD.  I, Garnette CHRISTELLA Simpler, MD, have reviewed all documentation for this visit. The documentation on 12/02/2023 for the exam, diagnosis, procedures, and orders are all accurate and complete.

## 2023-12-02 NOTE — Assessment & Plan Note (Signed)
 Overall health is good. Recommend she start engaging in regular exercise. Discussed recommended screenings and immunizations.
